# Patient Record
Sex: Female | Born: 1970 | Race: Black or African American | Hispanic: No | Marital: Married | Smoking: Current every day smoker
Health system: Southern US, Community
[De-identification: ages and names within clinical notes are randomized; demographics above are authoritative.]

## PROBLEM LIST (undated history)

## (undated) DIAGNOSIS — N926 Irregular menstruation, unspecified: Secondary | ICD-10-CM

## (undated) DIAGNOSIS — D649 Anemia, unspecified: Secondary | ICD-10-CM

---

## 2018-04-29 ENCOUNTER — Observation Stay (HOSPITAL_BASED_OUTPATIENT_CLINIC_OR_DEPARTMENT_OTHER)
Admission: EM | Admit: 2018-04-29 | Discharge: 2018-05-01 | Disposition: A | Discharge status: Home / Self Care | Payer: Self-pay | Attending: Internal Medicine | Admitting: Internal Medicine

## 2018-04-29 ENCOUNTER — Encounter (HOSPITAL_BASED_OUTPATIENT_CLINIC_OR_DEPARTMENT_OTHER): Payer: Self-pay | Admitting: *Deleted

## 2018-04-29 ENCOUNTER — Other Ambulatory Visit: Payer: Self-pay

## 2018-04-29 DIAGNOSIS — D509 Iron deficiency anemia, unspecified: Principal | ICD-10-CM | POA: Diagnosis present

## 2018-04-29 DIAGNOSIS — D649 Anemia, unspecified: Secondary | ICD-10-CM | POA: Diagnosis present

## 2018-04-29 DIAGNOSIS — R002 Palpitations: Secondary | ICD-10-CM | POA: Insufficient documentation

## 2018-04-29 DIAGNOSIS — N926 Irregular menstruation, unspecified: Secondary | ICD-10-CM | POA: Insufficient documentation

## 2018-04-29 DIAGNOSIS — Z880 Allergy status to penicillin: Secondary | ICD-10-CM | POA: Insufficient documentation

## 2018-04-29 HISTORY — DX: Irregular menstruation, unspecified: N92.6

## 2018-04-29 HISTORY — DX: Anemia, unspecified: D64.9

## 2018-04-29 MED ORDER — SODIUM CHLORIDE 0.9 % IV BOLUS
1000.0000 mL | Freq: Once | INTRAVENOUS | Status: AC
  Administered 2018-04-30: 1000 mL via INTRAVENOUS

## 2018-04-29 NOTE — ED Triage Notes (Signed)
Pt reports feeling rapid heartbeat "like anxiety" since Monday. States the week before she had begun taking apricot seed supplement for a few days. Stats she also has had some dizziness and swelling in some of her fingers and toes.

## 2018-04-30 ENCOUNTER — Encounter (HOSPITAL_COMMUNITY): Payer: Self-pay | Admitting: Internal Medicine

## 2018-04-30 ENCOUNTER — Emergency Department (HOSPITAL_BASED_OUTPATIENT_CLINIC_OR_DEPARTMENT_OTHER): Payer: Self-pay

## 2018-04-30 DIAGNOSIS — D649 Anemia, unspecified: Secondary | ICD-10-CM

## 2018-04-30 DIAGNOSIS — D509 Iron deficiency anemia, unspecified: Secondary | ICD-10-CM

## 2018-04-30 LAB — ABO/RH: ABO/RH(D): O POS

## 2018-04-30 LAB — COMPREHENSIVE METABOLIC PANEL
ALBUMIN: 3.9 g/dL (ref 3.5–5.0)
ALT: 8 U/L (ref 0–44)
ANION GAP: 7 (ref 5–15)
AST: 16 U/L (ref 15–41)
Alkaline Phosphatase: 62 U/L (ref 38–126)
BILIRUBIN TOTAL: 0.3 mg/dL (ref 0.3–1.2)
BUN: 8 mg/dL (ref 6–20)
CHLORIDE: 108 mmol/L (ref 98–111)
CO2: 21 mmol/L — ABNORMAL LOW (ref 22–32)
Calcium: 8.7 mg/dL — ABNORMAL LOW (ref 8.9–10.3)
Creatinine, Ser: 0.84 mg/dL (ref 0.44–1.00)
GFR calc Af Amer: >60 mL/min (ref >60)
GFR calc non Af Amer: >60 mL/min (ref >60)
GLUCOSE: 118 mg/dL — AB (ref 70–99)
POTASSIUM: 3.8 mmol/L (ref 3.5–5.1)
Sodium: 136 mmol/L (ref 135–145)
TOTAL PROTEIN: 7.4 g/dL (ref 6.5–8.1)

## 2018-04-30 LAB — URINALYSIS, MICROSCOPIC (REFLEX)

## 2018-04-30 LAB — RETICULOCYTES
RBC.: 3.32 MIL/uL — AB (ref 3.87–5.11)
RETIC COUNT ABSOLUTE: 43.2 10*3/uL (ref 19.0–186.0)
Retic Ct Pct: 1.3 % (ref 0.4–3.1)

## 2018-04-30 LAB — HEMOGLOBIN AND HEMATOCRIT, BLOOD
HCT: 30.7 % — ABNORMAL LOW (ref 36.0–46.0)
Hemoglobin: 8.8 g/dL — ABNORMAL LOW (ref 12.0–15.0)

## 2018-04-30 LAB — CBC
HCT: 20.1 % — ABNORMAL LOW (ref 36.0–46.0)
Hemoglobin: 5.5 g/dL — CL (ref 12.0–15.0)
MCH: 16.7 pg — ABNORMAL LOW (ref 26.0–34.0)
MCHC: 27.4 g/dL — ABNORMAL LOW (ref 30.0–36.0)
MCV: 61.1 fL — AB (ref 78.0–100.0)
PLATELETS: 612 10*3/uL — AB (ref 150–400)
RBC: 3.29 MIL/uL — ABNORMAL LOW (ref 3.87–5.11)
RDW: 22.8 % — AB (ref 11.5–15.5)
WBC: 8.8 10*3/uL (ref 4.0–10.5)

## 2018-04-30 LAB — PREGNANCY, URINE: PREG TEST UR: NEGATIVE

## 2018-04-30 LAB — URINALYSIS, ROUTINE W REFLEX MICROSCOPIC
Bilirubin Urine: NEGATIVE
GLUCOSE, UA: NEGATIVE mg/dL
KETONES UR: NEGATIVE mg/dL
LEUKOCYTES UA: NEGATIVE
Nitrite: NEGATIVE
PROTEIN: NEGATIVE mg/dL
Specific Gravity, Urine: <1.005 — ABNORMAL LOW (ref 1.005–1.030)
pH: 5.5 (ref 5.0–8.0)

## 2018-04-30 LAB — PREPARE RBC (CROSSMATCH)

## 2018-04-30 LAB — FERRITIN: FERRITIN: 2 ng/mL — AB (ref 11–307)

## 2018-04-30 LAB — IRON AND TIBC
Iron: 13 ug/dL — ABNORMAL LOW (ref 28–170)
Saturation Ratios: 3 % — ABNORMAL LOW (ref 10.4–31.8)
TIBC: 463 ug/dL — ABNORMAL HIGH (ref 250–450)
UIBC: 450 ug/dL

## 2018-04-30 LAB — VITAMIN B12: Vitamin B-12: 354 pg/mL (ref 180–914)

## 2018-04-30 LAB — OCCULT BLOOD X 1 CARD TO LAB, STOOL: FECAL OCCULT BLD: NEGATIVE

## 2018-04-30 LAB — HIV ANTIBODY (ROUTINE TESTING W REFLEX): HIV SCREEN 4TH GENERATION: NONREACTIVE

## 2018-04-30 LAB — FOLATE: FOLATE: 9.8 ng/mL (ref >5.9)

## 2018-04-30 LAB — LIPASE, BLOOD: LIPASE: 33 U/L (ref 11–51)

## 2018-04-30 MED ORDER — ONDANSETRON HCL 4 MG PO TABS
4.0000 mg | ORAL_TABLET | Freq: Four times a day (QID) | ORAL | Status: DC | PRN
  Administered 2018-05-01: 4 mg via ORAL
  Filled 2018-04-30: qty 1

## 2018-04-30 MED ORDER — ACETAMINOPHEN 650 MG RE SUPP: 650.0000 mg | Freq: Four times a day (QID) | RECTAL | Status: DC | PRN

## 2018-04-30 MED ORDER — SODIUM CHLORIDE 0.9% IV SOLUTION
Freq: Once | INTRAVENOUS | Status: AC
  Administered 2018-04-30: 1000 mL via INTRAVENOUS

## 2018-04-30 MED ORDER — ENOXAPARIN SODIUM 40 MG/0.4ML ~~LOC~~ SOLN: 40.0000 mg | SUBCUTANEOUS | Status: DC

## 2018-04-30 MED ORDER — FERROUS SULFATE 325 (65 FE) MG PO TABS
325.0000 mg | ORAL_TABLET | Freq: Two times a day (BID) | ORAL | Status: DC
  Administered 2018-04-30: 325 mg via ORAL
  Filled 2018-04-30: qty 1

## 2018-04-30 MED ORDER — SODIUM CHLORIDE 0.9 % IV SOLN
510.0000 mg | Freq: Once | INTRAVENOUS | Status: AC
  Administered 2018-04-30: 510 mg via INTRAVENOUS
  Filled 2018-04-30: qty 17

## 2018-04-30 MED ORDER — ONDANSETRON HCL 4 MG/2ML IJ SOLN: 4.0000 mg | Freq: Four times a day (QID) | INTRAMUSCULAR | Status: DC | PRN

## 2018-04-30 MED ORDER — FERROUS SULFATE 325 (65 FE) MG PO TABS
325.0000 mg | ORAL_TABLET | Freq: Two times a day (BID) | ORAL | Status: DC
  Administered 2018-05-01: 325 mg via ORAL
  Filled 2018-04-30: qty 1

## 2018-04-30 MED ORDER — SODIUM CHLORIDE 0.9% IV SOLUTION
Freq: Once | INTRAVENOUS | Status: DC
  Filled 2018-04-30: qty 250

## 2018-04-30 MED ORDER — ACETAMINOPHEN 325 MG PO TABS
650.0000 mg | ORAL_TABLET | Freq: Four times a day (QID) | ORAL | Status: DC | PRN
  Administered 2018-04-30 (×2): 650 mg via ORAL
  Filled 2018-04-30 (×2): qty 2

## 2018-04-30 NOTE — Progress Notes (Signed)
First sample of stool for Fecal Occult Blood was sent to the lab.

## 2018-04-30 NOTE — Progress Notes (Signed)
Tele monitor was D/C per MD order. Will continue to monitor.

## 2018-04-30 NOTE — ED Notes (Signed)
Lab called and reports a hgb of 5.5. Primary nurse and Dr. Patria Maneampos aware.

## 2018-04-30 NOTE — Progress Notes (Signed)
Blood Bank will call whenever blood is ready to be transfused.

## 2018-04-30 NOTE — Plan of Care (Signed)
Patient with symptomatic anemia, DOE for past 1-2 weeks.  Diagnosed with anemia years ago but not on iron supplementation.  HGB 5.5.  No acute bleeding stigmata, no menorrhagia history.  Didn't get hem occulted though.  Will put in for Tele bed and 2u PRBC transfusion as well as anemia pnl.

## 2018-04-30 NOTE — Progress Notes (Signed)
Admitting [facility] notified patient has arrived

## 2018-04-30 NOTE — Progress Notes (Signed)
Lab notified of Stat type & screen from 158 AM to be drawn.

## 2018-04-30 NOTE — Progress Notes (Signed)
Patient very upset about her meals today. We tried to talk with the kitchen but they sent to her cold food and with no taste. She ordered three trays for lunch and she refused all of them.

## 2018-04-30 NOTE — Progress Notes (Signed)
  PROGRESS NOTE  Patient admitted earlier this morning. See H&P. Maria Wright is a 47 y.o. female with medical history significant of chronic anemia for many years. She believes she has been anemic since her last pregnancy which was 29 years ago. Patient presents to the ED with complaint of lightheadedness and dyspnea with exertion over the past 1-2 weeks.  She denies heavy vaginal bleeding, but states that she uses approx 30 pads during a menstrual cycle. She denies any other blood loss, no black tarry stools or bright red blood per rectum. She denies nausea, vomiting.   Transfuse 2u pRBC Will order IV feraheme as her ferritin is only 2  Repeat labs in AM  FOBT pending    Noralee StainJennifer Finlee Concepcion, DO [facility] [email or web address] Password [email or web address] 04/30/2018, 11:17 AM

## 2018-04-30 NOTE — ED Provider Notes (Addendum)
[facility] EMERGENCY DEPARTMENT Provider Note   CSN: [id number] Arrival date & [id number]: 04/29/18  2301     History   Chief Complaint Chief Complaint  Patient presents with  . Palpitations    HPI Maria Wright is a 47 y.o. female.  HPI Patient is a 47 year old female presents to the emergency department with lightheadedness and shortness of breath with exertion over the past 1 to 2 weeks.  She has a history of anemia diagnosed years ago.  She is not on iron supplementation.  She has not had a physical exam in several years.  She denies heavy vaginal bleeding.  Denies abdominal pain.  Denies nausea vomiting.  No diarrhea.  Reports mild anorexia over the past several days but is still drinking fluids.  No dysuria and urinary frequency.  She began using apricot seed supplement a few days ago to see if this would help with her strength.  No other complaints.   History reviewed. No pertinent past medical history.  There are no active problems to display for this patient.   History reviewed. No pertinent surgical history.   OB History   None      Home Medications    Prior to Admission medications   Not on File    Family History No family history on file.  Social History Social History   Tobacco Use  . Smoking status: Current Every Day Smoker  . Smokeless tobacco: Never Used  Substance Use Topics  . Alcohol use: Not Currently  . Drug use: Yes    Types: Marijuana     Allergies   Penicillins   Review of Systems Review of Systems  All other systems reviewed and are negative.    Physical Exam Updated Vital Signs BP (!) 160/71 (BP Location: Left Arm)   Pulse 89   Temp 98.5 F (36.9 C) (Oral)   Resp (!) 22   Ht 5\' 4"  (1.626 m)   Wt 65.8 kg (145 lb)   LMP 04/01/2018   SpO2 100%   BMI 24.89 kg/m   Physical Exam  Constitutional: She is oriented to person, place, and time. She appears well-developed and well-nourished. No distress.  HENT:    Head: Normocephalic and atraumatic.  Eyes: EOM are normal.  Neck: Normal range of motion.  Cardiovascular: Normal rate, regular rhythm and normal heart sounds.  Pulmonary/Chest: Effort normal and breath sounds normal.  Abdominal: Soft. She exhibits no distension. There is no tenderness.  Musculoskeletal: Normal range of motion.  Neurological: She is alert and oriented to person, place, and time.  Skin: Skin is warm and dry.  Psychiatric: She has a normal mood and affect. Judgment normal.  Nursing note and vitals reviewed.    ED Treatments / Results  Labs (all labs ordered are listed, but only abnormal results are displayed) Labs Reviewed  CBC - Abnormal; Notable for the following components:      Result Value   RBC 3.29 (*)    Hemoglobin 5.5 (*)    HCT 20.1 (*)    MCV 61.1 (*)    MCH 16.7 (*)    MCHC 27.4 (*)    RDW 22.8 (*)    Platelets 612 (*)    All other components within normal limits  URINALYSIS, ROUTINE W REFLEX MICROSCOPIC - Abnormal; Notable for the following components:   Specific Gravity, Urine <1.005 (*)    Hgb urine dipstick TRACE (*)    All other components within normal limits  COMPREHENSIVE METABOLIC PANEL -  Abnormal; Notable for the following components:   CO2 21 (*)    Glucose, Bld 118 (*)    Calcium 8.7 (*)    All other components within normal limits  URINALYSIS, MICROSCOPIC (REFLEX) - Abnormal; Notable for the following components:   Bacteria, UA RARE (*)    All other components within normal limits  PREGNANCY, URINE  LIPASE, BLOOD  VITAMIN B12  FOLATE  IRON AND TIBC  FERRITIN  RETICULOCYTES    EKG EKG Interpretation  Date/Time:  Saturday April 29 2018 23:11:15 EDT Ventricular Rate:  89 PR Interval:  150 QRS Duration: 68 QT Interval:  360 QTC Calculation: 438 R Axis:   47 Text Interpretation:  Normal sinus rhythm Normal ECG No old tracing to compare Confirmed by Azalia Bilis ([id number]) on 04/29/2018 11:22:56 PM   Radiology Dg Chest 2  View  Result Date: 04/30/2018 CLINICAL DATA:  Dizziness and tachycardia. Numbness in the fingers for 1 week. EXAM: CHEST - 2 VIEW COMPARISON:  Chest CT 03/20/2015. FINDINGS: The heart size and mediastinal contours are normal. The lungs are clear. There is no pleural effusion or pneumothorax. No acute osseous findings are identified. IMPRESSION: No active cardiopulmonary process. Electronically Signed   By: Carey Bullocks M.D.   On: 04/30/2018 00:40    Procedures .Critical Care Performed by: Azalia Bilis, MD Authorized by: Azalia Bilis, MD     CRITICAL CARE Performed by: Azalia Bilis Total critical care time: 31 minutes Critical care time was exclusive of separately billable procedures and treating other patients. Critical care was necessary to treat or prevent imminent or life-threatening deterioration. Critical care was time spent personally by me on the following activities: development of treatment plan with patient and/or surrogate as well as nursing, discussions with consultants, evaluation of patient's response to treatment, examination of patient, obtaining history from patient or surrogate, ordering and performing treatments and interventions, ordering and review of laboratory studies, ordering and review of radiographic studies, pulse oximetry and re-evaluation of patient's condition.   Medications Ordered in ED Medications  sodium chloride 0.9 % bolus 1,000 mL (1,000 mLs Intravenous New Bag/Given 04/30/18 0029)     Initial Impression / Assessment and Plan / ED Course  I have reviewed the triage vital signs and the nursing notes.  Pertinent labs & imaging results that were available during my care of the patient were reviewed by me and considered in my medical decision making (see chart for details).     Sent to back anemia.  Patient will need admission in the hospital for blood transfusion given her hemoglobin of 5.5.  Anemia panel ordered.  Patient updated.  Final  Clinical Impressions(s) / ED Diagnoses   Final diagnoses:  Symptomatic anemia    ED Discharge Orders    None       Azalia Bilis, MD 04/30/18 Lenoria Chime    Azalia Bilis, MD 05/05/18 1105

## 2018-04-30 NOTE — ED Notes (Signed)
Report given to ChurchvilleJustin, Charity fundraiserN. [facility]

## 2018-04-30 NOTE — Progress Notes (Signed)
Writer called blood bank and they said they don't have an order to prepare 2 units of blood. They had an order only to do type and screen. MD was notified. Will continue to monitor.

## 2018-04-30 NOTE — H&P (Signed)
History and Physical    Maria Wright WUJ:[id number] DOB: 05-29-1971 DOA: 04/29/2018  PCP: Patient, No Pcp Per  Patient coming from: Home  I have personally briefly reviewed patient's old medical records in [facility]  Chief Complaint: Palpitations  HPI: Maria Wright is a 47 y.o. female with medical history significant of Anemia for many years, iron deficiency she thinks, not taking supplementation.  Hasnt had blood levels checked or a physical exam in several years.  Patient presents to the ED with c/o lightheadedness and DOE over the past 1-2 weeks.  She denies heavy vaginal bleeding.  Denies abdominal pain.  Denies nausea vomiting.  No diarrhea.  Reports mild anorexia over the past several days but is still drinking fluids.  No melena, no hematochezia.  She does note she has irregular periods that are sometimes heavy she thinks.   ED Course: HGB 5.5 in a microcytic / hypochromic pattern.  Iron panel confirms iron deficiency.   Review of Systems: As per HPI otherwise 10 point review of systems negative.   Past Medical History:  Diagnosis Date  . Anemia   . Irregular periods     History reviewed. No pertinent surgical history.   reports that she has been smoking.  She has never used smokeless tobacco. She reports that she drank alcohol. She reports that she has current or past drug history. Drug: Marijuana.  Allergies  Allergen Reactions  . Penicillins Hives    Family History  Problem Relation Age of Onset  . Iron deficiency Mother      Prior to Admission medications   Not on File    Physical Exam: Vitals:   04/29/18 2319 04/30/18 0226 04/30/18 0300 04/30/18 0336  BP: (!) 160/71 130/60  (!) 147/86  Pulse: 89 92  91  Resp: (!) 22 18  20   Temp: 98.5 F (36.9 C)     TempSrc: Oral     SpO2: 100% 100%  100%  Weight:      Height:   5\' 4"  (1.626 m)     Constitutional: NAD, calm, comfortable Eyes: PERRL, lids and conjunctivae normal ENMT: Mucous  membranes are moist. Posterior pharynx clear of any exudate or lesions.Normal dentition.  Neck: normal, supple, no masses, no thyromegaly Respiratory: clear to auscultation bilaterally, no wheezing, no crackles. Normal respiratory effort. No accessory muscle use.  Cardiovascular: Regular rate and rhythm, no murmurs / rubs / gallops. No extremity edema. 2+ pedal pulses. No carotid bruits.  Abdomen: no tenderness, no masses palpated. No hepatosplenomegaly. Bowel sounds positive.  Musculoskeletal: no clubbing / cyanosis. No joint deformity upper and lower extremities. Good ROM, no contractures. Normal muscle tone.  Skin: no rashes, lesions, ulcers. No induration Neurologic: CN 2-12 grossly intact. Sensation intact, DTR normal. Strength 5/5 in all 4.  Psychiatric: Normal judgment and insight. Alert and oriented x 3. Normal mood.    Labs on Admission: I have personally reviewed following labs and imaging studies  CBC: Recent Labs  Lab 04/30/18 0027  WBC 8.8  HGB 5.5*  HCT 20.1*  MCV 61.1*  PLT 612*   Basic Metabolic Panel: Recent Labs  Lab 04/30/18 0027  NA 136  K 3.8  CL 108  CO2 21*  GLUCOSE 118*  BUN 8  CREATININE 0.84  CALCIUM 8.7*   GFR: Estimated Creatinine Clearance: 76.4 mL/min (by C-G formula based on SCr of 0.84 mg/dL). Liver Function Tests: Recent Labs  Lab 04/30/18 0027  AST 16  ALT 8  ALKPHOS 62  BILITOT  0.3  PROT 7.4  ALBUMIN 3.9   Recent Labs  Lab 04/30/18 0027  LIPASE 33   No results for input(s): AMMONIA in the last 168 hours. Coagulation Profile: No results for input(s): INR, PROTIME in the last 168 hours. Cardiac Enzymes: No results for input(s): CKTOTAL, CKMB, CKMBINDEX, TROPONINI in the last 168 hours. BNP (last 3 results) No results for input(s): PROBNP in the last 8760 hours. HbA1C: No results for input(s): HGBA1C in the last 72 hours. CBG: No results for input(s): GLUCAP in the last 168 hours. Lipid Profile: No results for input(s):  CHOL, HDL, LDLCALC, TRIG, CHOLHDL, LDLDIRECT in the last 72 hours. Thyroid Function Tests: No results for input(s): TSH, T4TOTAL, FREET4, T3FREE, THYROIDAB in the last 72 hours. Anemia Panel: Recent Labs    04/30/18 0027  VITAMINB12 354  FOLATE 9.8  FERRITIN 2*  TIBC 463*  IRON 13*  RETICCTPCT 1.3   Urine analysis:    Component Value Date/Time   COLORURINE YELLOW 04/29/2018 2330   APPEARANCEUR CLEAR 04/29/2018 2330   LABSPEC <1.005 (L) 04/29/2018 2330   PHURINE 5.5 04/29/2018 2330   GLUCOSEU NEGATIVE 04/29/2018 2330   HGBUR TRACE (A) 04/29/2018 2330   BILIRUBINUR NEGATIVE 04/29/2018 2330   KETONESUR NEGATIVE 04/29/2018 2330   PROTEINUR NEGATIVE 04/29/2018 2330   NITRITE NEGATIVE 04/29/2018 2330   LEUKOCYTESUR NEGATIVE 04/29/2018 2330    Radiological Exams on Admission: Dg Chest 2 View  Result Date: 04/30/2018 CLINICAL DATA:  Dizziness and tachycardia. Numbness in the fingers for 1 week. EXAM: CHEST - 2 VIEW COMPARISON:  Chest CT 03/20/2015. FINDINGS: The heart size and mediastinal contours are normal. The lungs are clear. There is no pleural effusion or pneumothorax. No acute osseous findings are identified. IMPRESSION: No active cardiopulmonary process. Electronically Signed   By: Carey BullocksWilliam  Veazey M.D.   On: 04/30/2018 00:40    EKG: Independently reviewed.  Assessment/Plan Principal Problem:   Symptomatic anemia Active Problems:   Iron deficiency anemia    1. Symptomatic iron deficiency anemia - probably very chronic, no acute blood loss source identified 1. Transfuse 2u PRBC 2. Start PO iron 3. Repeat H/H post transfusion 4. Send stool for hemoccult, but no stigmata of acute large GIB on HPI  DVT prophylaxis: Lovenox Code Status: Full Family Communication: Husband at bedside, they are reading over the transfusion consent form now, all questions answered. Disposition Plan: Home after admit Consults called: None Admission status: Place in obs   Hillary BowGARDNER, Lavita Pontius  M. DO [facility] Pager [phone] Only works nights!  If 7AM-7PM, please contact the primary day team physician taking care of patient  [email or web address] Password [email or web address]  04/30/2018, 4:26 AM

## 2018-05-01 LAB — TYPE AND SCREEN
ABO/RH(D): O POS
Antibody Screen: NEGATIVE
Unit division: 0
Unit division: 0

## 2018-05-01 LAB — BPAM RBC
BLOOD PRODUCT EXPIRATION DATE: 201908102359
Blood Product Expiration Date: 201908102359
ISSUE DATE / TIME: 201907140755
ISSUE DATE / TIME: 201907141052
Unit Type and Rh: 5100
Unit Type and Rh: 5100

## 2018-05-01 LAB — CBC
HCT: 30.4 % — ABNORMAL LOW (ref 36.0–46.0)
Hemoglobin: 8.6 g/dL — ABNORMAL LOW (ref 12.0–15.0)
MCH: 19.6 pg — ABNORMAL LOW (ref 26.0–34.0)
MCHC: 28.3 g/dL — ABNORMAL LOW (ref 30.0–36.0)
MCV: 69.4 fL — ABNORMAL LOW (ref 78.0–100.0)
PLATELETS: 507 10*3/uL — AB (ref 150–400)
RBC: 4.38 MIL/uL (ref 3.87–5.11)
RDW: 27.9 % — AB (ref 11.5–15.5)
WBC: 8 10*3/uL (ref 4.0–10.5)

## 2018-05-01 MED ORDER — FERROUS SULFATE 325 (65 FE) MG PO TABS: 325.0000 mg | ORAL_TABLET | Freq: Two times a day (BID) | ORAL | 0 refills | Status: DC

## 2018-05-01 MED ORDER — ALUM & MAG HYDROXIDE-SIMETH 200-200-20 MG/5ML PO SUSP
30.0000 mL | Freq: Four times a day (QID) | ORAL | Status: DC | PRN
  Administered 2018-05-01: 30 mL via ORAL
  Filled 2018-05-01: qty 30

## 2018-05-01 NOTE — Discharge Summary (Signed)
Physician Discharge Summary  Maria Wright:[name] DOB: 08/21/1971 DOA: 04/29/2018  PCP: Patient, No Pcp Per  Admit date: 04/29/2018 Discharge date: 05/01/2018  Admitted From: Home Disposition:  Home  Recommendations for Outpatient Follow-up:  1. Follow up with PCP in 1 week. Encouraged to establish with PCP as soon as possible.  2. Please obtain CBC in 1-2 weeks   Discharge Condition: Stable CODE STATUS: Full  Diet recommendation: Regular diet   Brief/Interim Summary: Maria Wright is a 47 y.o.femalewith medical history significant ofchronic anemia for many years. She believes she has been anemic since her last pregnancy which was 29 years ago.Patient presents to the ED with complaint of lightheadedness and dyspnea with exertion over the past 1-2 weeks. She denies heavy vaginal bleeding, but states that she uses approx 30 pads during a menstrual cycle. She denies any other blood loss, no black tarry stools or bright red blood per rectum. She denies nausea, vomiting. FOBT was negative. She was found to have Hgb 5.5 and was given 2u pRBC with improvement of Hgb to 8.8. Iron studies revealed iron deficiency anemia with iron 13, ferritin 2, saturation ratio 3, TIBC 463. She was given IV feraheme and prescription for oral iron supplementation.   Discharge Diagnoses:  Principal Problem:   Symptomatic anemia Active Problems:   Iron deficiency anemia   Discharge Instructions  Discharge Instructions    Call MD for:  difficulty breathing, headache or visual disturbances   Complete by:  As directed    Call MD for:  extreme fatigue   Complete by:  As directed    Call MD for:  hives   Complete by:  As directed    Call MD for:  persistant dizziness or light-headedness   Complete by:  As directed    Call MD for:  persistant nausea and vomiting   Complete by:  As directed    Call MD for:  severe uncontrolled pain   Complete by:  As directed    Call MD for:  temperature >100.4    Complete by:  As directed    Diet general   Complete by:  As directed    Discharge instructions   Complete by:  As directed    You were cared for by a hospitalist during your hospital stay. If you have any questions about your discharge medications or the care you received while you were in the hospital after you are discharged, you can call the unit and ask to speak with the hospitalist on call if the hospitalist that took care of you is not available. Once you are discharged, your primary care physician will handle any further medical issues. Please note that NO REFILLS for any discharge medications will be authorized once you are discharged, as it is imperative that you return to your primary care physician (or establish a relationship with a primary care physician if you do not have one) for your aftercare needs so that they can reassess your need for medications and monitor your lab values.   Increase activity slowly   Complete by:  As directed      Allergies as of 05/01/2018      Reactions   Penicillins Hives   Has patient had a PCN reaction causing immediate rash, facial/tongue/throat swelling, SOB or lightheadedness with hypotension: Yes Has patient had a PCN reaction causing severe rash involving mucus membranes or skin necrosis: Yes Has patient had a PCN reaction that required hospitalization: No Has patient had a PCN reaction  occurring within the last 10 years: No If all of the above answers are "NO", then may proceed with Cephalosporin use.      Medication List    TAKE these medications   ferrous sulfate 325 (65 FE) MG tablet Take 1 tablet (325 mg total) by mouth 2 (two) times daily with a meal.      Follow-up Information    [facility] Follow up.   Why:  Call to establish care with a primary care provider as soon as possible  Contact information: [address] [id number] [phone]         Allergies   Allergen Reactions  . Penicillins Hives    Has patient had a PCN reaction causing immediate rash, facial/tongue/throat swelling, SOB or lightheadedness with hypotension: Yes Has patient had a PCN reaction causing severe rash involving mucus membranes or skin necrosis: Yes Has patient had a PCN reaction that required hospitalization: No Has patient had a PCN reaction occurring within the last 10 years: No If all of the above answers are "NO", then may proceed with Cephalosporin use.     Consultations:  None    Procedures/Studies: Dg Chest 2 View  Result Date: 04/30/2018 CLINICAL DATA:  Dizziness and tachycardia. Numbness in the fingers for 1 week. EXAM: CHEST - 2 VIEW COMPARISON:  Chest CT 03/20/2015. FINDINGS: The heart size and mediastinal contours are normal. The lungs are clear. There is no pleural effusion or pneumothorax. No acute osseous findings are identified. IMPRESSION: No active cardiopulmonary process. Electronically Signed   By: Carey Bullocks M.D.   On: 04/30/2018 00:40       Discharge Exam: Vitals:   04/30/18 2151 05/01/18 0417  BP: 130/86 131/75  Pulse: 64 66  Resp: 18 18  Temp: 98.1 F (36.7 C) 98.4 F (36.9 C)  SpO2: 100% 100%    General: Pt is alert, awake, not in acute distress Cardiovascular: RRR, S1/S2 +, no rubs, no gallops Respiratory: CTA bilaterally, no wheezing, no rhonchi Abdominal: Soft, NT, ND, bowel sounds + Extremities: no edema, no cyanosis    The results of significant diagnostics from this hospitalization (including imaging, microbiology, ancillary and laboratory) are listed below for reference.     Microbiology: No results found for this or any previous visit (from the past 240 hour(s)).   Labs: BNP (last 3 results) No results for input(s): BNP in the last 8760 hours. Basic Metabolic Panel: Recent Labs  Lab 04/30/18 0027  NA 136  K 3.8  CL 108  CO2 21*  GLUCOSE 118*  BUN 8  CREATININE 0.84  CALCIUM 8.7*   Liver  Function Tests: Recent Labs  Lab 04/30/18 0027  AST 16  ALT 8  ALKPHOS 62  BILITOT 0.3  PROT 7.4  ALBUMIN 3.9   Recent Labs  Lab 04/30/18 0027  LIPASE 33   No results for input(s): AMMONIA in the last 168 hours. CBC: Recent Labs  Lab 04/30/18 0027 04/30/18 1514 05/01/18 0633  WBC 8.8  --  8.0  HGB 5.5* 8.8* 8.6*  HCT 20.1* 30.7* 30.4*  MCV 61.1*  --  69.4*  PLT 612*  --  507*   Cardiac Enzymes: No results for input(s): CKTOTAL, CKMB, CKMBINDEX, TROPONINI in the last 168 hours. BNP: Invalid input(s): POCBNP CBG: No results for input(s): GLUCAP in the last 168 hours. D-Dimer No results for input(s): DDIMER in the last 72 hours. Hgb A1c No results for input(s): HGBA1C in the  last 72 hours. Lipid Profile No results for input(s): CHOL, HDL, LDLCALC, TRIG, CHOLHDL, LDLDIRECT in the last 72 hours. Thyroid function studies No results for input(s): TSH, T4TOTAL, T3FREE, THYROIDAB in the last 72 hours.  Invalid input(s): FREET3 Anemia work up Recent Labs    04/30/18 0027  VITAMINB12 354  FOLATE 9.8  FERRITIN 2*  TIBC 463*  IRON 13*  RETICCTPCT 1.3   Urinalysis    Component Value Date/Time   COLORURINE YELLOW 04/29/2018 2330   APPEARANCEUR CLEAR 04/29/2018 2330   LABSPEC <1.005 (L) 04/29/2018 2330   PHURINE 5.5 04/29/2018 2330   GLUCOSEU NEGATIVE 04/29/2018 2330   HGBUR TRACE (A) 04/29/2018 2330   BILIRUBINUR NEGATIVE 04/29/2018 2330   KETONESUR NEGATIVE 04/29/2018 2330   PROTEINUR NEGATIVE 04/29/2018 2330   NITRITE NEGATIVE 04/29/2018 2330   LEUKOCYTESUR NEGATIVE 04/29/2018 2330   Sepsis Labs Invalid input(s): PROCALCITONIN,  WBC,  LACTICIDVEN Microbiology No results found for this or any previous visit (from the past 240 hour(s)).   Patient was seen and examined on the day of discharge and was found to be in stable condition. Time coordinating discharge: 25 minutes including assessment and coordination of care, as well as examination of the patient.    SIGNED:  Noralee Stain, DO [facility] Pager [phone]  If 7PM-7AM, please contact night-coverage [email or web address] Password [facility] 05/01/2018, 8:39 AM

## 2018-05-01 NOTE — Progress Notes (Signed)
Patient discharged home with husband. Discharge instructions given, all questions answered. Extended amount of time was spent providing education around the importance of rest, increasing dietary iron, attending follow up appointments, and taking all medications as prescribed. All belongings sent with patient.

## 2018-05-01 NOTE — Care Management Note (Signed)
Case Management Note  Patient Details  Name: Maria Wright MRN: [id number] Date of Birth: 1971-03-27  Subjective/Objective:   Pt admitted on 04/29/18 with symptomatic iron deficiency anemia.  PTA, pt independent of ADLS, lives with spouse.                 Action/Plan: Pt medically stable for dc home today.  Pt is uninsured, and has no PCP.  [facility] at 2 [facility], with pt consent.  Appt information put on AVS in Epic.    Expected Discharge Date:  05/01/18               Expected Discharge Plan:  Home/Self Care  In-House Referral:     Discharge planning Services  CM Consult, Indigent [facility], Follow-up appt scheduled  Post Acute Care Choice:    Choice offered to:     DME Arranged:    DME Agency:     HH Arranged:    HH Agency:     Status of Service:  Completed, signed off  If discussed at [employer] Meetings, dates discussed:    Additional Comments:  Glennon Macmerson, Taisha Pennebaker M, RN 05/01/2018, 10:23 AM

## 2018-05-01 NOTE — Discharge Instructions (Signed)

## 2018-05-16 ENCOUNTER — Inpatient Hospital Stay: Payer: Self-pay

## 2018-09-01 ENCOUNTER — Encounter (HOSPITAL_BASED_OUTPATIENT_CLINIC_OR_DEPARTMENT_OTHER): Payer: Self-pay | Admitting: *Deleted

## 2018-09-01 ENCOUNTER — Emergency Department (HOSPITAL_BASED_OUTPATIENT_CLINIC_OR_DEPARTMENT_OTHER)
Admission: EM | Admit: 2018-09-01 | Discharge: 2018-09-01 | Disposition: A | Discharge status: Home / Self Care | Payer: Self-pay | Attending: Emergency Medicine | Admitting: Emergency Medicine

## 2018-09-01 ENCOUNTER — Emergency Department (HOSPITAL_BASED_OUTPATIENT_CLINIC_OR_DEPARTMENT_OTHER): Payer: Self-pay

## 2018-09-01 ENCOUNTER — Other Ambulatory Visit: Payer: Self-pay

## 2018-09-01 DIAGNOSIS — R030 Elevated blood-pressure reading, without diagnosis of hypertension: Secondary | ICD-10-CM | POA: Insufficient documentation

## 2018-09-01 DIAGNOSIS — J069 Acute upper respiratory infection, unspecified: Secondary | ICD-10-CM | POA: Insufficient documentation

## 2018-09-01 DIAGNOSIS — N3000 Acute cystitis without hematuria: Secondary | ICD-10-CM | POA: Insufficient documentation

## 2018-09-01 DIAGNOSIS — R0602 Shortness of breath: Secondary | ICD-10-CM | POA: Insufficient documentation

## 2018-09-01 DIAGNOSIS — M7918 Myalgia, other site: Secondary | ICD-10-CM | POA: Insufficient documentation

## 2018-09-01 LAB — COMPREHENSIVE METABOLIC PANEL
ALT: 17 U/L (ref 0–44)
ANION GAP: 11 (ref 5–15)
AST: 19 U/L (ref 15–41)
Albumin: 4 g/dL (ref 3.5–5.0)
Alkaline Phosphatase: 83 U/L (ref 38–126)
BILIRUBIN TOTAL: 0.5 mg/dL (ref 0.3–1.2)
BUN: 10 mg/dL (ref 6–20)
CALCIUM: 9.4 mg/dL (ref 8.9–10.3)
CO2: 23 mmol/L (ref 22–32)
Chloride: 104 mmol/L (ref 98–111)
Creatinine, Ser: 0.78 mg/dL (ref 0.44–1.00)
GFR calc non Af Amer: >60 mL/min (ref >60)
GLUCOSE: 92 mg/dL (ref 70–99)
Potassium: 3.7 mmol/L (ref 3.5–5.1)
Sodium: 138 mmol/L (ref 135–145)
TOTAL PROTEIN: 8 g/dL (ref 6.5–8.1)

## 2018-09-01 LAB — URINALYSIS, MICROSCOPIC (REFLEX)

## 2018-09-01 LAB — MAGNESIUM: MAGNESIUM: 2.1 mg/dL (ref 1.7–2.4)

## 2018-09-01 LAB — URINALYSIS, ROUTINE W REFLEX MICROSCOPIC
Bilirubin Urine: NEGATIVE
Glucose, UA: NEGATIVE mg/dL
Ketones, ur: NEGATIVE mg/dL
LEUKOCYTES UA: NEGATIVE
NITRITE: POSITIVE — AB
PROTEIN: NEGATIVE mg/dL
SPECIFIC GRAVITY, URINE: 1.025 (ref 1.005–1.030)
pH: 6 (ref 5.0–8.0)

## 2018-09-01 LAB — CBC WITH DIFFERENTIAL/PLATELET
Abs Immature Granulocytes: 0.03 10*3/uL (ref 0.00–0.07)
Basophils Absolute: 0 10*3/uL (ref 0.0–0.1)
Basophils Relative: 0 %
EOS ABS: 0 10*3/uL (ref 0.0–0.5)
EOS PCT: 0 %
HEMATOCRIT: 38.8 % (ref 36.0–46.0)
Hemoglobin: 12.1 g/dL (ref 12.0–15.0)
IMMATURE GRANULOCYTES: 0 %
Lymphocytes Relative: 24 %
Lymphs Abs: 2.4 10*3/uL (ref 0.7–4.0)
MCH: 26.4 pg (ref 26.0–34.0)
MCHC: 31.2 g/dL (ref 30.0–36.0)
MCV: 84.7 fL (ref 80.0–100.0)
MONO ABS: 0.5 10*3/uL (ref 0.1–1.0)
MONOS PCT: 5 %
Neutro Abs: 7.2 10*3/uL (ref 1.7–7.7)
Neutrophils Relative %: 71 %
Platelets: 339 10*3/uL (ref 150–400)
RBC: 4.58 MIL/uL (ref 3.87–5.11)
RDW: 14.2 % (ref 11.5–15.5)
WBC: 10.3 10*3/uL (ref 4.0–10.5)
nRBC: 0 % (ref 0.0–0.2)

## 2018-09-01 LAB — PREGNANCY, URINE: PREG TEST UR: NEGATIVE

## 2018-09-01 MED ORDER — ONDANSETRON HCL 4 MG/2ML IJ SOLN
4.0000 mg | Freq: Once | INTRAMUSCULAR | Status: AC
  Administered 2018-09-01: 4 mg via INTRAVENOUS
  Filled 2018-09-01: qty 2

## 2018-09-01 MED ORDER — PROCHLORPERAZINE EDISYLATE 10 MG/2ML IJ SOLN
10.0000 mg | Freq: Once | INTRAMUSCULAR | Status: AC
  Administered 2018-09-01: 10 mg via INTRAVENOUS
  Filled 2018-09-01: qty 2

## 2018-09-01 MED ORDER — NITROFURANTOIN MONOHYD MACRO 100 MG PO CAPS
100.0000 mg | ORAL_CAPSULE | Freq: Once | ORAL | Status: AC
  Administered 2018-09-01: 100 mg via ORAL
  Filled 2018-09-01: qty 1

## 2018-09-01 MED ORDER — PROCHLORPERAZINE MALEATE 10 MG PO TABS: 10.0000 mg | ORAL_TABLET | Freq: Two times a day (BID) | ORAL | 0 refills | Status: AC | PRN

## 2018-09-01 MED ORDER — BUTALBITAL-APAP-CAFFEINE 50-325-40 MG PO TABS
1.0000 | ORAL_TABLET | Freq: Once | ORAL | Status: DC
  Filled 2018-09-01: qty 1

## 2018-09-01 MED ORDER — DIPHENHYDRAMINE HCL 50 MG/ML IJ SOLN: 25.0000 mg | Freq: Once | INTRAMUSCULAR | Status: DC

## 2018-09-01 MED ORDER — PROCHLORPERAZINE EDISYLATE 10 MG/2ML IJ SOLN: 10.0000 mg | Freq: Once | INTRAMUSCULAR | Status: DC

## 2018-09-01 MED ORDER — NITROFURANTOIN MONOHYD MACRO 100 MG PO CAPS: 100.0000 mg | ORAL_CAPSULE | Freq: Two times a day (BID) | ORAL | 0 refills | Status: DC

## 2018-09-01 NOTE — ED Triage Notes (Signed)
Pt has been having High BPs for 2 weeks accompanied by SOB. Just not feeling well now. States that she is dizzy as well.

## 2018-09-01 NOTE — ED Provider Notes (Signed)
[facility] EMERGENCY DEPARTMENT Provider Note   CSN: [id number] Arrival date & time: 09/01/18  1101     History   Chief Complaint Chief Complaint  Patient presents with  . Hypertension    HPI Maria Wright is a 47 y.o. female.  The history is provided by the patient and medical records. No language interpreter was used.  Cough  This is a new problem. The current episode started more than 1 week ago. The problem occurs constantly. The problem has not changed since onset.The cough is non-productive. There has been no fever. Associated symptoms include chills, rhinorrhea, myalgias and shortness of breath. Pertinent negatives include no chest pain, no weight loss, no ear congestion, no headaches and no wheezing. She has tried nothing for the symptoms. The treatment provided no relief.    Past Medical History:  Diagnosis Date  . Anemia   . Irregular periods     Patient Active Problem List   Diagnosis Date Noted  . Symptomatic anemia 04/30/2018  . Iron deficiency anemia 04/30/2018    History reviewed. No pertinent surgical history.   OB History   None      Home Medications    Prior to Admission medications   Medication Sig Start Date End Date Taking? Authorizing Provider  ferrous sulfate 325 (65 FE) MG tablet Take 1 tablet (325 mg total) by mouth 2 (two) times daily with a meal. 05/01/18   Noralee Stain, DO    Family History Family History  Problem Relation Age of Onset  . Iron deficiency Mother     Social History Social History   Tobacco Use  . Smoking status: Current Every Day Smoker  . Smokeless tobacco: Never Used  Substance Use Topics  . Alcohol use: Not Currently  . Drug use: Yes    Types: Marijuana     Allergies   Penicillins   Review of Systems Review of Systems  Constitutional: Positive for chills and fatigue. Negative for appetite change, diaphoresis, fever and weight loss.  HENT: Positive for congestion and rhinorrhea.     Respiratory: Positive for cough and shortness of breath. Negative for chest tightness, wheezing and stridor.   Cardiovascular: Negative for chest pain, palpitations and leg swelling.  Gastrointestinal: Negative for abdominal pain, constipation, diarrhea, nausea and vomiting.  Genitourinary: Negative for dysuria, flank pain and frequency.  Musculoskeletal: Positive for myalgias. Negative for back pain, neck pain and neck stiffness.  Skin: Negative for rash and wound.  Neurological: Negative for light-headedness, numbness and headaches.  Psychiatric/Behavioral: Negative for agitation.  All other systems reviewed and are negative.    Physical Exam Updated Vital Signs BP (!) 162/95 (BP Location: Left Arm)   Pulse 66   Temp 98.2 F (36.8 C) (Oral)   Resp 18   Ht 5\' 4"  (1.626 m)   Wt 66 kg   LMP 08/20/2018 (Exact Date)   SpO2 100%   BMI 24.98 kg/m   Physical Exam  Constitutional: She is oriented to person, place, and time. She appears well-developed and well-nourished. No distress.  HENT:  Head: Normocephalic.  Nose: Rhinorrhea present.  Mouth/Throat: Oropharynx is clear and moist. No oropharyngeal exudate.  Eyes: Pupils are equal, round, and reactive to light. Conjunctivae and EOM are normal.  Neck: Normal range of motion.  Cardiovascular: Normal rate and intact distal pulses.  No murmur heard. Pulmonary/Chest: Effort normal and breath sounds normal. No stridor. No respiratory distress. She has no wheezes. She exhibits no tenderness.  Abdominal: Soft. Bowel sounds  are normal. She exhibits no distension. There is no tenderness. There is no rebound.  Musculoskeletal: She exhibits no edema or tenderness.  Lymphadenopathy:    She has no cervical adenopathy.  Neurological: She is alert and oriented to person, place, and time. No sensory deficit. She exhibits normal muscle tone.  Skin: Skin is warm. Capillary refill takes less than 2 seconds. No rash noted. She is not diaphoretic. No  erythema.  Psychiatric: She has a normal mood and affect.  Nursing note and vitals reviewed.    ED Treatments / Results  Labs (all labs ordered are listed, but only abnormal results are displayed) Labs Reviewed  URINALYSIS, ROUTINE W REFLEX MICROSCOPIC - Abnormal; Notable for the following components:      Result Value   Hgb urine dipstick MODERATE (*)    Nitrite POSITIVE (*)    All other components within normal limits  URINALYSIS, MICROSCOPIC (REFLEX) - Abnormal; Notable for the following components:   Bacteria, UA MANY (*)    All other components within normal limits  URINE CULTURE  CBC WITH DIFFERENTIAL/PLATELET  COMPREHENSIVE METABOLIC PANEL  MAGNESIUM  PREGNANCY, URINE    EKG EKG Interpretation  Date/Time:  Friday September 01 2018 12:35:33 EST Ventricular Rate:  58 PR Interval:    QRS Duration: 77 QT Interval:  458 QTC Calculation: 450 R Axis:   62 Text Interpretation:  Sinus rhythm Consider left ventricular hypertrophy When compared to prior, no significant changes seen.  No STEMI Confirmed by Theda Belfast ([id number]) on 09/01/2018 12:37:46 PM   Radiology Dg Chest 2 View  Result Date: 09/01/2018 CLINICAL DATA:  Cough, chest congestion for several weeks. Current smoker. EXAM: CHEST - 2 VIEW COMPARISON:  PA and lateral chest x-ray of April 30, 2018 FINDINGS: The lungs are mildly hyperinflated and clear. The heart and pulmonary vascularity are normal. The mediastinum is normal in width. There is no pleural effusion. The bony thorax is unremarkable. IMPRESSION: Mild hyperinflation consistent with COPD or reactive airway disease. No pneumonia, CHF, nor other acute cardiopulmonary abnormality. Electronically Signed   By: David  [place] M.D.   On: 09/01/2018 12:37    Procedures Procedures (including critical care time)  Medications Ordered in ED Medications  nitrofurantoin (macrocrystal-monohydrate) (MACROBID) capsule 100 mg (has no administration in time range)    ondansetron (ZOFRAN) injection 4 mg (4 mg Intravenous Given 09/01/18 1213)  prochlorperazine (COMPAZINE) injection 10 mg (10 mg Intravenous Given 09/01/18 1238)     Initial Impression / Assessment and Plan / ED Course  I have reviewed the triage vital signs and the nursing notes.  Pertinent labs & imaging results that were available during my care of the patient were reviewed by me and considered in my medical decision making (see chart for details).     Maria Wright is a 47 y.o. female with a past medical history significant for symptom medic anemia several months ago currently on iron who presents with malaise, chills, cough, occasional shortness of breath, nausea, constipation, and concern for elevated blood pressure.  Patient says that she is been feeling bad for the last 2 weeks and has had continued dry cough.  She says that she took her blood pressure when she was feeling worse yesterday and was found to have a blood pressure of around 167.  Patient says she is never had a history of high blood pressure problems.  She talked to her mother who recommended she come to the emergency department evaluation.  On arrival, patient says she still  having chills and malaise.  She has not taken her temperature and has not had a fever at home.  She has had no urinary symptoms but has had nausea.  No vomiting.  She reports chronic constipation in the setting of her iron pills.  No diarrhea.  No chest pain or palpitations.  He does report the shortness of breath with the cough.  She reports some mild headache with photophobia.  She reports some lightheadedness but no syncopal episodes or room spinning dizziness.  On arrival, blood pressure was 162 systolic.  On exam, no focal neurologic deficit seen.  Lungs are clear and chest is nontender.  Abdomen is nontender.  Legs are nonedematous.  Oropharyngeal exam unremarkable.  Patient had some congestion that was appreciated on exam.  Neck was not stiff and  not tender.  No rashes seen.  Clinically I suspect patient has a viral URI contributing to her malaise and symptoms.  In the setting of this I suspect her blood pressure is elevated.  Due to the 2 weeks of symptoms, patient will have chest x-ray to look for pneumonia, urinalysis to look for UTI and some screening blood work.  Anticipate reassessment after work-up.  She was given nausea medicine for nausea.       We discussed how isolated elevated blood pressures in the setting of other symptoms such as URI may not mean that she has chronic hypertension.  Patient will likely need to be seen by PCP in follow-up to determine if she has persistent elevated blood pressures.  1:43 PM Patient's laboratory testing showed no evidence of ab normality with CBC or CMP.  Urinalysis shows nitrites and bacteria.  Suspect UTI.  Due to allergy to penicillins, patient given prescription and a dose of nitrofurantoin to help clear this.  Pregnancy is negative.  Culture was sent.  Chest x-ray shows evidence of possible COPD but no pneumonia.  Patient was feeling better after the Compazine for headache and nausea.  Blood pressure improved into the 150s.    In the setting of acute infection and with improvement of blood pressure with headache management, do not feel comfortable starting patient on any blood pressure medicine until all other abnormalities have been treated.  Patient had no focal neurologic deficits and patient had normal gait walking to the bathroom.    Patient will follow with a PCP for further blood pressure monitoring and for reassessment.  Patient understood return precautions for any new or worsened symptoms.    Patient with questions or concerns and was discharged in good condition.  Final Clinical Impressions(s) / ED Diagnoses   Final diagnoses:  Acute cystitis without hematuria  Upper respiratory tract infection, unspecified type  Elevated BP without diagnosis of hypertension    ED  Discharge Orders         Ordered    nitrofurantoin, macrocrystal-monohydrate, (MACROBID) 100 MG capsule  2 times daily     09/01/18 1340    prochlorperazine (COMPAZINE) 10 MG tablet  2 times daily PRN     09/01/18 1340         Clinical Impression: 1. Acute cystitis without hematuria   2. Upper respiratory tract infection, unspecified type   3. Elevated BP without diagnosis of hypertension     Disposition: Discharge  Condition: Good  I have discussed the results, Dx and Tx plan with the pt(& family if present). He/she/they expressed understanding and agree(s) with the plan. Discharge instructions discussed at great length. Strict return precautions discussed and  pt &/or family have verbalized understanding of the instructions. No further questions at time of discharge.    New Prescriptions   NITROFURANTOIN, MACROCRYSTAL-MONOHYDRATE, (MACROBID) 100 MG CAPSULE    Take 1 capsule (100 mg total) by mouth 2 (two) times daily.   PROCHLORPERAZINE (COMPAZINE) 10 MG TABLET    Take 1 tablet (10 mg total) by mouth 2 (two) times daily as needed for nausea or vomiting.    Follow Up: [facility] [address] [place] [address] [phone] Schedule an appointment as soon as possible for a visit    [facility] [place]GENCY DEPARTMENT [address] [id number] [address] [place] [address] [phone]       Sedalia Greeson, Canary Brim, MD 09/01/18 1344

## 2018-09-01 NOTE — Discharge Instructions (Signed)
Please follow-up with a primary care physician to have your blood pressure reexamined.  We found evidence of urinary tract infection today and I suspect this is contributing to your symptoms.  Also for likely viral infection causing you to have the malaise, cough, and nausea symptoms.  Your blood pressure improved after you are feeling better, please use the nausea medicine to help with your headache and nausea.  If any symptoms change or worsen, please return to the nearest emergency department.  Please stay hydrated.

## 2018-09-01 NOTE — ED Notes (Signed)
Pt in X-Ray ?

## 2018-09-03 LAB — URINE CULTURE

## 2018-09-04 ENCOUNTER — Telehealth: Payer: Self-pay | Admitting: *Deleted

## 2018-09-04 NOTE — Telephone Encounter (Signed)
Post ED Visit - Positive Culture Follow-up  Culture report reviewed by antimicrobial stewardship pharmacist:  []  Maria Wright, Pharm.D. []  Maria Wright, Pharm.D., BCPS AQ-ID []  Maria Wright, Pharm.D., BCPS []  Maria Wright, Pharm.D., BCPS []  Maria Wright, [address]Pharm[address]D., BCPS, AAHIVP []  Maria Wright, Pharm.D., BCPS, AAHIVP [x]  Maria Wright, PharmD, BCPS []  Maria Wright, PharmD, BCPS []  Maria Wright, PharmD, BCPS []  Maria Wright, PharmD  Positive urine culture Treated with Nitrofurantoin Monohyd Macro organism sensitive to the same and no further patient follow-up is required at this time.  Maria Wright, Maria Wright [facility] 09/04/2018, 11:34 AM

## 2019-07-30 IMAGING — CR DG CHEST 2V
2 series · 2 of 2 positions shown · non-contrast
Comparison: PA and lateral chest x-ray April 30, 2018

CLINICAL DATA: Cough, chest congestion for several weeks. Current
smoker.

EXAM:
CHEST - 2 VIEW

[w chest pa]
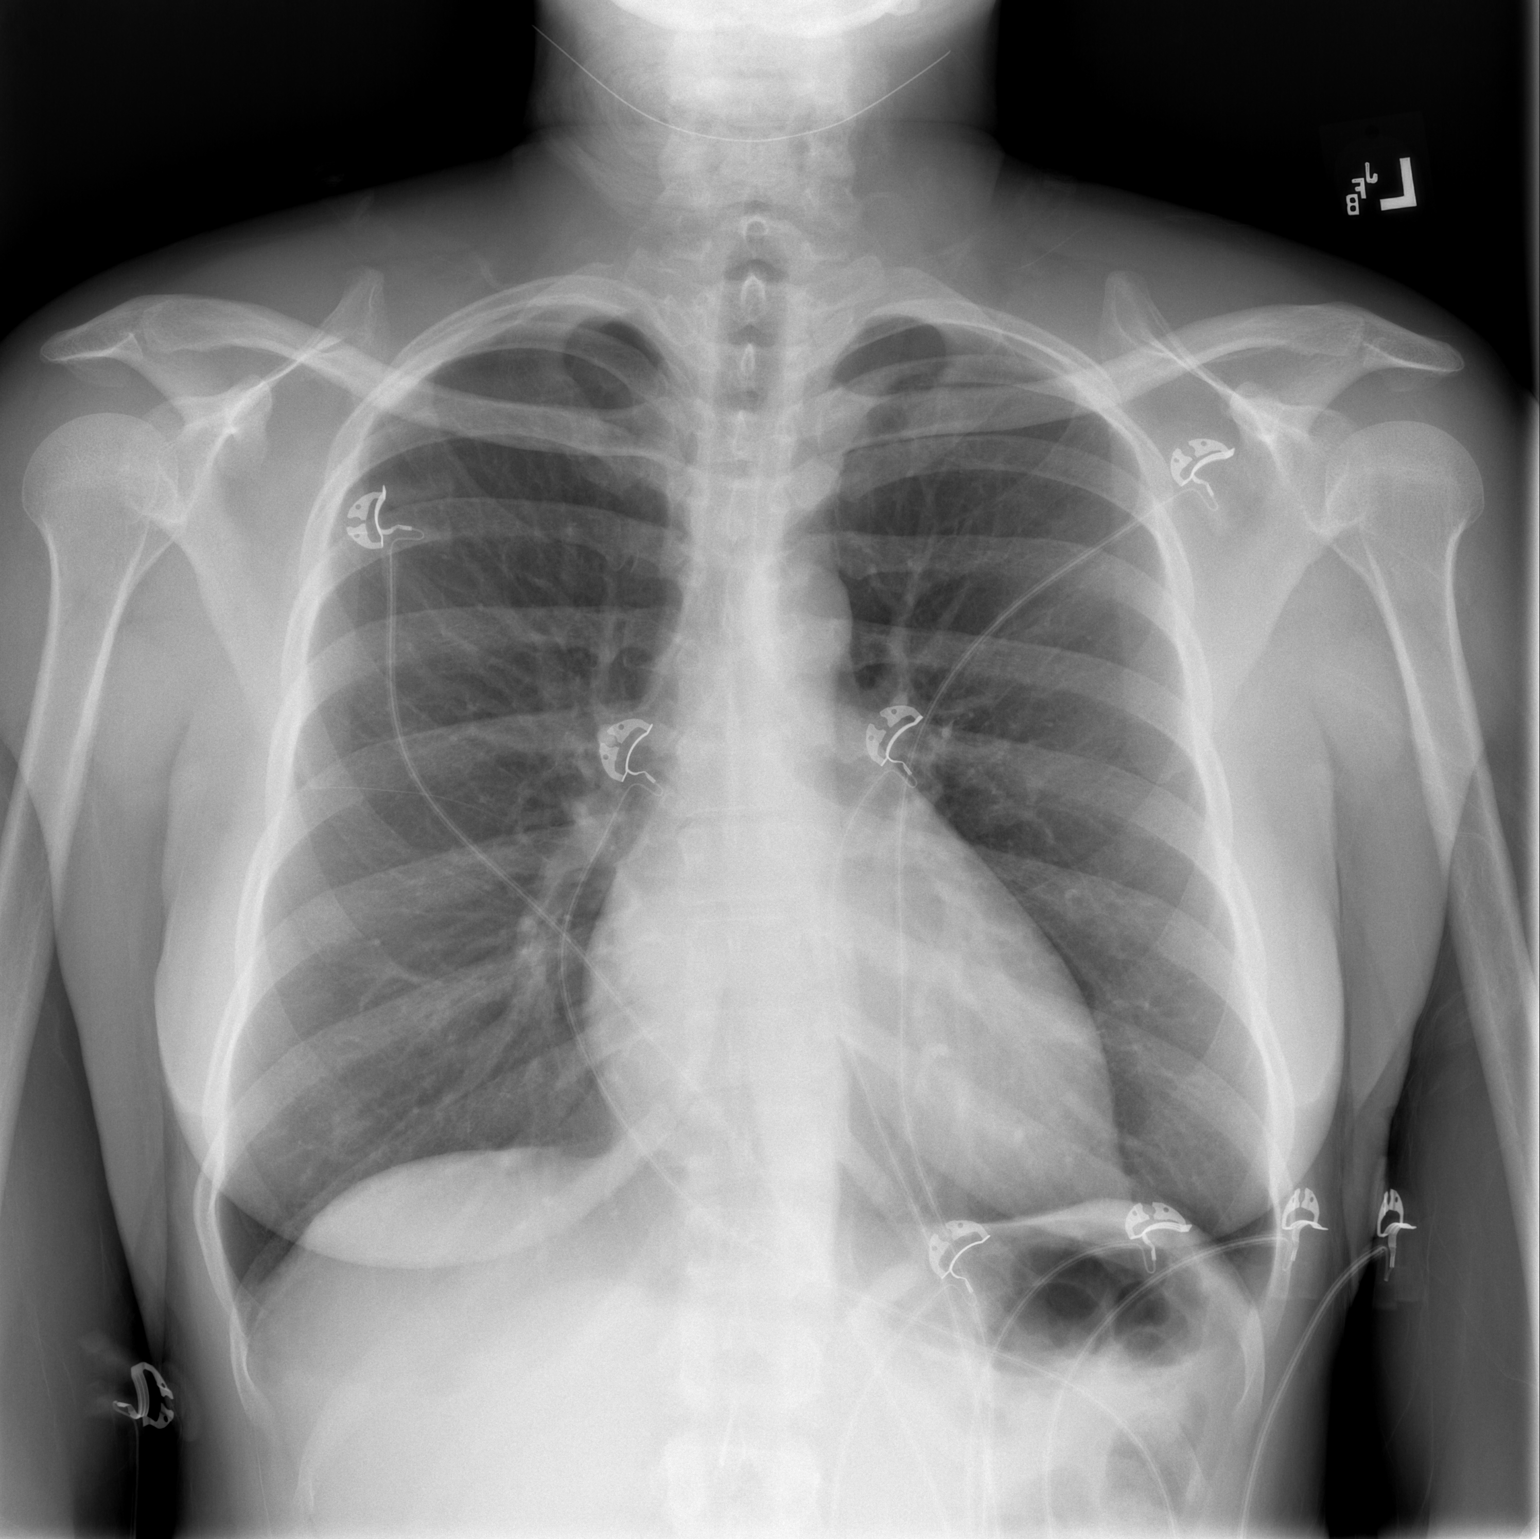

[w chest lat]
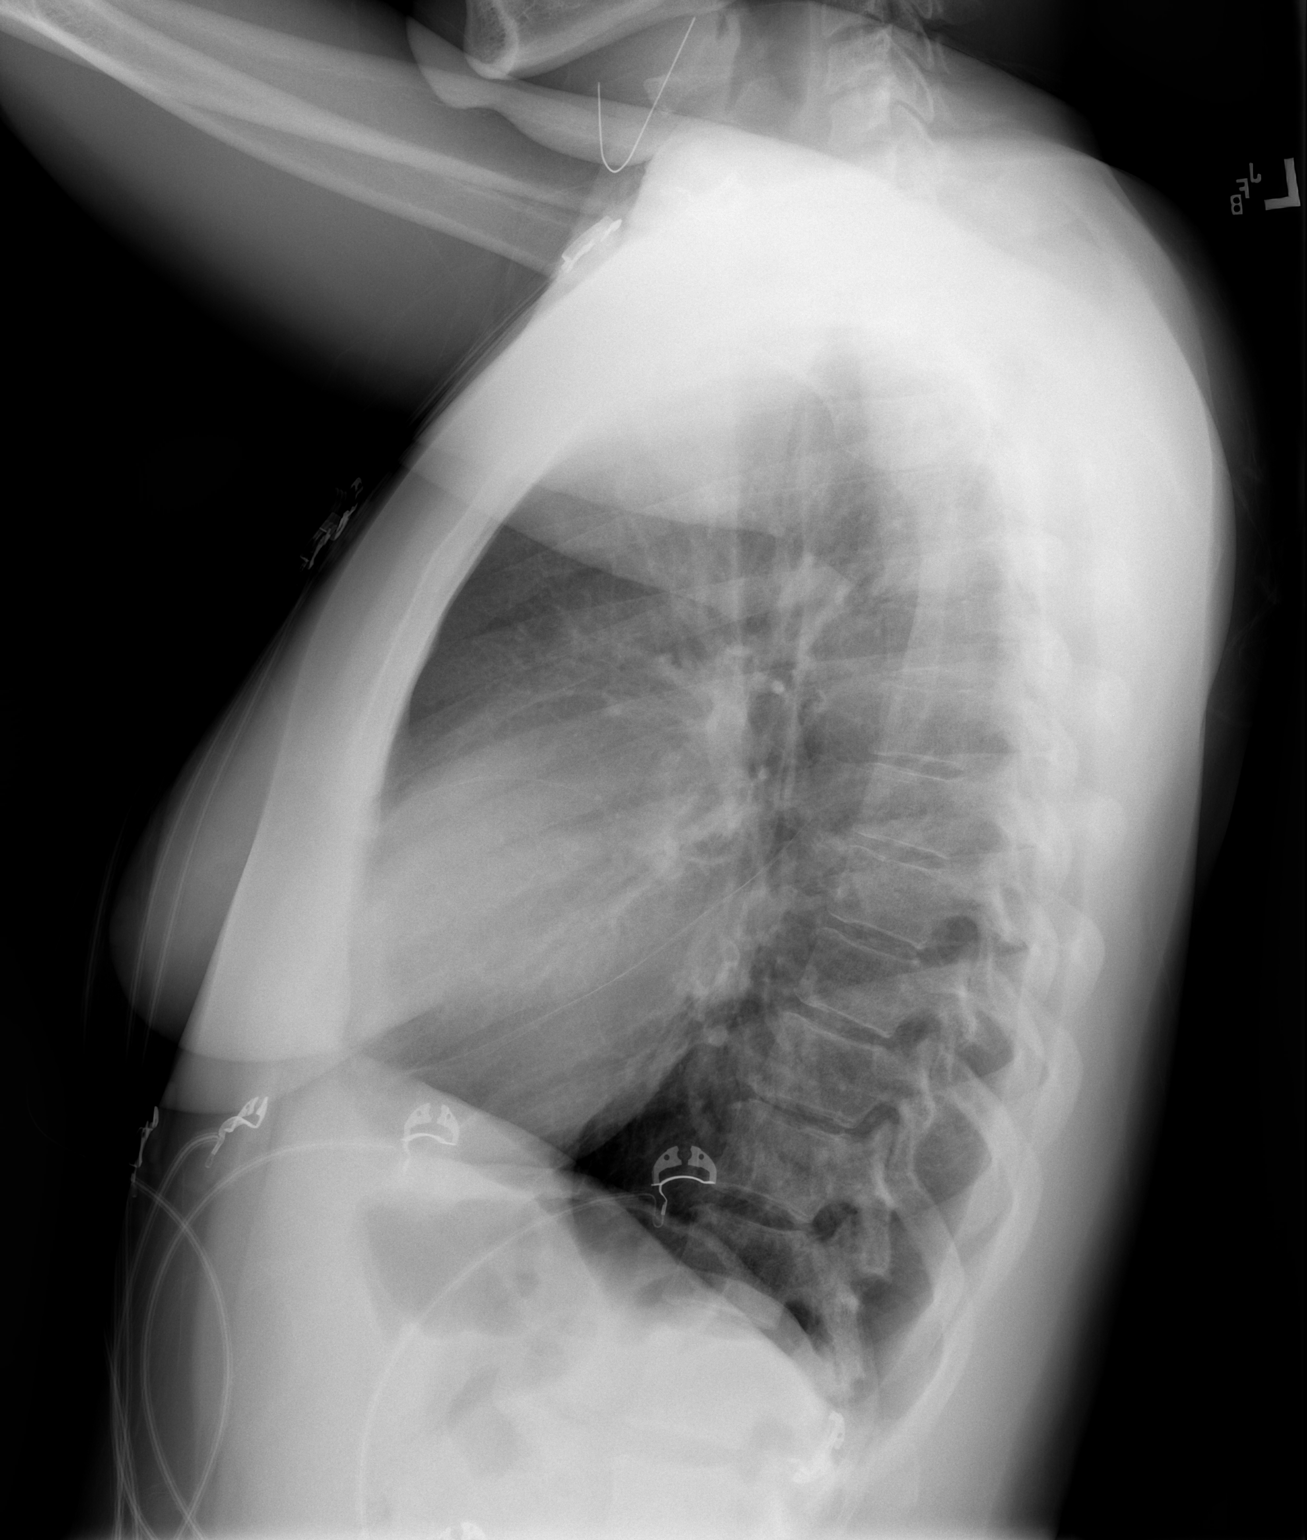

[2 of 2 positions shown; findings below may reference images not displayed]

FINDINGS: The lungs are mildly hyperinflated and clear. The heart and
pulmonary vascularity are normal. The mediastinum is normal in
width. There is no pleural effusion. The bony thorax is
unremarkable.
IMPRESSION: Mild hyperinflation consistent with COPD or reactive airway disease.
No pneumonia, CHF, nor other acute cardiopulmonary abnormality.

## 2024-03-22 ENCOUNTER — Other Ambulatory Visit: Payer: Self-pay

## 2024-03-22 ENCOUNTER — Emergency Department (HOSPITAL_COMMUNITY)

## 2024-03-22 ENCOUNTER — Emergency Department (HOSPITAL_COMMUNITY)
Admission: EM | Admit: 2024-03-22 | Discharge: 2024-03-22 | Disposition: A | Discharge status: Home / Self Care | Attending: Emergency Medicine | Admitting: Emergency Medicine

## 2024-03-22 DIAGNOSIS — R1013 Epigastric pain: Secondary | ICD-10-CM

## 2024-03-22 DIAGNOSIS — K2970 Gastritis, unspecified, without bleeding: Secondary | ICD-10-CM | POA: Diagnosis not present

## 2024-03-22 DIAGNOSIS — K8020 Calculus of gallbladder without cholecystitis without obstruction: Secondary | ICD-10-CM | POA: Diagnosis not present

## 2024-03-22 DIAGNOSIS — R200 Anesthesia of skin: Secondary | ICD-10-CM | POA: Diagnosis not present

## 2024-03-22 LAB — URINALYSIS, ROUTINE W REFLEX MICROSCOPIC
Bilirubin Urine: NEGATIVE
Glucose, UA: NEGATIVE mg/dL
Ketones, ur: NEGATIVE mg/dL
Leukocytes,Ua: NEGATIVE
Nitrite: NEGATIVE
Protein, ur: NEGATIVE mg/dL
Specific Gravity, Urine: <1.005 — ABNORMAL LOW (ref 1.005–1.030)
pH: 7.5 (ref 5.0–8.0)

## 2024-03-22 LAB — COMPREHENSIVE METABOLIC PANEL WITH GFR
ALT: 13 U/L (ref 0–44)
AST: 20 U/L (ref 15–41)
Albumin: 3.9 g/dL (ref 3.5–5.0)
Alkaline Phosphatase: 61 U/L (ref 38–126)
Anion gap: 11 (ref 5–15)
BUN: 15 mg/dL (ref 6–20)
CO2: 19 mmol/L — ABNORMAL LOW (ref 22–32)
Calcium: 9.5 mg/dL (ref 8.9–10.3)
Chloride: 111 mmol/L (ref 98–111)
Creatinine, Ser: 1.13 mg/dL — ABNORMAL HIGH (ref 0.44–1.00)
GFR, Estimated: 58 mL/min — ABNORMAL LOW (ref >60)
Glucose, Bld: 97 mg/dL (ref 70–99)
Potassium: 3.7 mmol/L (ref 3.5–5.1)
Sodium: 141 mmol/L (ref 135–145)
Total Bilirubin: 1 mg/dL (ref 0.0–1.2)
Total Protein: 7 g/dL (ref 6.5–8.1)

## 2024-03-22 LAB — CBC
HCT: 29.2 % — ABNORMAL LOW (ref 36.0–46.0)
Hemoglobin: 10 g/dL — ABNORMAL LOW (ref 12.0–15.0)
MCH: 38.2 pg — ABNORMAL HIGH (ref 26.0–34.0)
MCHC: 34.2 g/dL (ref 30.0–36.0)
MCV: 111.5 fL — ABNORMAL HIGH (ref 80.0–100.0)
Platelets: 196 10*3/uL (ref 150–400)
RBC: 2.62 MIL/uL — ABNORMAL LOW (ref 3.87–5.11)
RDW: 17.2 % — ABNORMAL HIGH (ref 11.5–15.5)
WBC: 7.7 10*3/uL (ref 4.0–10.5)
nRBC: 0.4 % — ABNORMAL HIGH (ref 0.0–0.2)

## 2024-03-22 LAB — D-DIMER, QUANTITATIVE: D-Dimer, Quant: 0.32 ug{FEU}/mL (ref 0.00–0.50)

## 2024-03-22 LAB — URINALYSIS, MICROSCOPIC (REFLEX): Bacteria, UA: NONE SEEN

## 2024-03-22 LAB — LIPASE, BLOOD: Lipase: 26 U/L (ref 11–51)

## 2024-03-22 MED ORDER — MORPHINE SULFATE (PF) 2 MG/ML IV SOLN
2.0000 mg | Freq: Once | INTRAVENOUS | Status: AC
  Administered 2024-03-22: 2 mg via INTRAVENOUS
  Filled 2024-03-22: qty 1

## 2024-03-22 MED ORDER — ONDANSETRON HCL 4 MG/2ML IJ SOLN
4.0000 mg | Freq: Once | INTRAMUSCULAR | Status: AC
  Administered 2024-03-22: 4 mg via INTRAVENOUS
  Filled 2024-03-22: qty 2

## 2024-03-22 MED ORDER — SUCRALFATE 1 G PO TABS
1.0000 g | ORAL_TABLET | Freq: Three times a day (TID) | ORAL | 0 refills | Status: AC
Start: 2024-03-22 — End: ?

## 2024-03-22 MED ORDER — LACTATED RINGERS IV BOLUS
1000.0000 mL | Freq: Once | INTRAVENOUS | Status: AC
  Administered 2024-03-22: 1000 mL via INTRAVENOUS

## 2024-03-22 MED ORDER — SUCRALFATE 1 G PO TABS
1.0000 g | ORAL_TABLET | Freq: Once | ORAL | Status: AC
  Administered 2024-03-22: 1 g via ORAL
  Filled 2024-03-22: qty 1

## 2024-03-22 MED ORDER — IOHEXOL 350 MG/ML SOLN
75.0000 mL | Freq: Once | INTRAVENOUS | Status: AC | PRN
Start: 2024-03-22 — End: 2024-03-22
  Administered 2024-03-22: 75 mL via INTRAVENOUS

## 2024-03-22 MED ORDER — OXYCODONE-ACETAMINOPHEN 5-325 MG PO TABS: 2.0000 | ORAL_TABLET | Freq: Once | ORAL | Status: DC

## 2024-03-22 MED ORDER — OXYCODONE-ACETAMINOPHEN 5-325 MG PO TABS
2.0000 | ORAL_TABLET | Freq: Once | ORAL | Status: AC
  Administered 2024-03-22: 2 via ORAL
  Filled 2024-03-22: qty 2

## 2024-03-22 NOTE — ED Triage Notes (Signed)
 Pt. Stated, Im having pain under my breast and numbness and nausea that started a month ago. Ive been to UC x twice. Ive been waiting to see an orthopaedic for the numbness in my hands.

## 2024-03-22 NOTE — ED Provider Notes (Signed)
 [place] EMERGENCY DEPARTMENT AT [facility] Provider Note   CSN: [id number] Arrival date & time: 03/22/24  4098     History  Chief Complaint  Patient presents with   pain under breast   Numbness   Nausea    Maria Wright is a 53 y.o. female.  HPI 53 year old female presents today complaining of epigastric to left upper abdominal pain.  She states she has had pain off and on for several weeks but this got worse during the night into the early morning hours.  She also reports that she feels like both of her hands are swelling.  She has been referred to orthopedic surgery but has had her appointments canceled twice.  She reports that she does not work but she does do some activities with her hands that require repeated wrist flexion and extension.  She has had some nausea and vomiting.  Symptoms began after she ate food last night.  However she does not clearly have symptoms that are connected to food intake.  She has not had fever or chills or UTI symptoms.  She denies any similar symptoms in the past.    Home Medications Prior to Admission medications   Medication Sig Start Date End Date Taking? Authorizing Provider  sucralfate (CARAFATE) 1 g tablet Take 1 tablet (1 g total) by mouth 4 (four) times daily -  with meals and at bedtime. 03/22/24  Yes Auston Blush, MD  ferrous sulfate  325 (65 FE) MG tablet Take 1 tablet (325 mg total) by mouth 2 (two) times daily with a meal. 05/01/18   Daren Eck, DO  nitrofurantoin , macrocrystal-monohydrate, (MACROBID ) 100 MG capsule Take 1 capsule (100 mg total) by mouth 2 (two) times daily. 09/01/18   Tegeler, Marine Sia, MD  prochlorperazine  (COMPAZINE ) 10 MG tablet Take 1 tablet (10 mg total) by mouth 2 (two) times daily as needed for nausea or vomiting. 09/01/18   Tegeler, Marine Sia, MD      Allergies    Penicillins    Review of Systems   Review of Systems  Physical Exam Updated Vital Signs BP 131/85   Pulse 66   Temp  98.9 F (37.2 C) (Oral)   Resp 12   Ht 1.626 m (5\' 4" )   Wt 72.6 kg   LMP  (LMP Unknown)   SpO2 99%   BMI 27.46 kg/m  Physical Exam Vitals reviewed.  Constitutional:      Appearance: Normal appearance.  HENT:     Head: Normocephalic.     Right Ear: External ear normal.     Left Ear: External ear normal.     Nose: Nose normal.     Mouth/Throat:     Pharynx: Oropharynx is clear.  Eyes:     Extraocular Movements: Extraocular movements intact.     Pupils: Pupils are equal, round, and reactive to light.  Cardiovascular:     Rate and Rhythm: Normal rate and regular rhythm.     Pulses: Normal pulses.     Heart sounds: Normal heart sounds.  Pulmonary:     Effort: Pulmonary effort is normal.     Breath sounds: Normal breath sounds.  Abdominal:     General: Abdomen is flat. There is no distension.     Tenderness: There is abdominal tenderness.     Comments: Epigastric tenderness with some tenderness to the right upper quadrant  Musculoskeletal:        General: No swelling, tenderness, deformity or signs of injury.  Cervical back: Normal range of motion.     Right lower leg: No edema.     Left lower leg: No edema.     Comments: Positive Tinel test at bilateral wrists  Skin:    General: Skin is warm and dry.     Capillary Refill: Capillary refill takes less than 2 seconds.  Neurological:     General: No focal deficit present.     Mental Status: She is alert.  Psychiatric:        Mood and Affect: Mood normal.     ED Results / Procedures / Treatments   Labs (all labs ordered are listed, but only abnormal results are displayed) Labs Reviewed  URINALYSIS, ROUTINE W REFLEX MICROSCOPIC - Abnormal; Notable for the following components:      Result Value   Specific Gravity, Urine <1.005 (*)    Hgb urine dipstick SMALL (*)    All other components within normal limits  CBC - Abnormal; Notable for the following components:   RBC 2.62 (*)    Hemoglobin 10.0 (*)    HCT 29.2  (*)    MCV 111.5 (*)    MCH 38.2 (*)    RDW 17.2 (*)    nRBC 0.4 (*)    All other components within normal limits  COMPREHENSIVE METABOLIC PANEL WITH GFR - Abnormal; Notable for the following components:   CO2 19 (*)    Creatinine, Ser 1.13 (*)    GFR, Estimated 58 (*)    All other components within normal limits  D-DIMER, QUANTITATIVE  LIPASE, BLOOD  URINALYSIS, MICROSCOPIC (REFLEX)    EKG None  Radiology US  Abdomen Limited Result Date: 03/22/2024 CLINICAL DATA:  upper abdominal pain EXAM: ULTRASOUND ABDOMEN LIMITED RIGHT UPPER QUADRANT COMPARISON:  March 22, 2024 FINDINGS: Gallbladder: A single mobile gallstone present. No wall thickening or pericholecystic fluid. Common bile duct: Diameter: 4 mm Liver: Normal echogenicity. No focal lesion identified. No intrahepatic biliary ductal dilation. Portal vein is patent on color Doppler imaging with normal direction of blood flow towards the liver. Other: The visualized portions of the pancreas are within normal limits. IMPRESSION: Cholecystolithiasis. No sonographic changes of acute cholecystitis. Electronically Signed   By: Rance Burrows M.D.   On: 03/22/2024 15:04   CT ABDOMEN PELVIS W WO CONTRAST Result Date: 03/22/2024 CLINICAL DATA:  Abdominal pain, acute, nonlocalized Abdominal/flank pain, stone suspected Abdominal pain with pain under breast and nausea. EXAM: CT ABDOMEN AND PELVIS WITHOUT AND WITH CONTRAST TECHNIQUE: Multidetector CT imaging of the abdomen and pelvis was performed following the standard protocol before and following the bolus administration of intravenous contrast. RADIATION DOSE REDUCTION: This exam was performed according to the departmental dose-optimization program which includes automated exposure control, adjustment of the mA and/or kV according to patient size and/or use of iterative reconstruction technique. CONTRAST:  75mL OMNIPAQUE IOHEXOL 350 MG/ML SOLN COMPARISON:  Abdominopelvic CT 03/20/2015. FINDINGS: Lower  chest: Probable mild scarring dependently at the right lung base. The visualized lung bases are otherwise clear. No pleural or pericardial effusion. Hepatobiliary: The liver is normal in density without suspicious focal abnormality. Small gallstones are noted. No evidence of gallbladder wall thickening, surrounding inflammation or biliary ductal dilatation. Pancreas: Unremarkable. No pancreatic ductal dilatation or surrounding inflammatory changes. Spleen: Normal in size without focal abnormality. Adrenals/Urinary Tract: Both adrenal glands appear normal. Pre contrast images demonstrate no renal, ureteral or bladder calculi. Following contrast, both kidneys enhance normally. No evidence of enhancing renal mass. There is a small cyst in  the upper pole of the right kidney for which no specific follow-up imaging is recommended. The bladder appears unremarkable for its degree of distention. Stomach/Bowel: No enteric contrast administered. The stomach appears unremarkable for its degree of distension. No evidence of bowel wall thickening, distention or surrounding inflammatory change. The appendix appears normal. Vascular/Lymphatic: There are no enlarged abdominal or pelvic lymph nodes. Aortic and branch vessel atherosclerosis without evidence of aneurysm or large vessel occlusion. Reproductive: The uterus and ovaries appear unremarkable. No adnexal mass. Other: No evidence of abdominal wall mass or hernia. No ascites or pneumoperitoneum. Musculoskeletal: No acute or significant osseous findings. IMPRESSION: 1. No acute findings or explanation for the patient's symptoms. 2. Cholelithiasis without evidence of cholecystitis or biliary ductal dilatation. 3. No evidence of urinary tract calculus or hydronephrosis. 4.  Aortic Atherosclerosis (ICD10-I70.0). Electronically Signed   By: Elmon Hagedorn M.D.   On: 03/22/2024 11:06   DG Chest Port 1 View Result Date: 03/22/2024 CLINICAL DATA:  Epigastric abdominal pain. EXAM:  PORTABLE CHEST 1 VIEW COMPARISON:  September 01, 2018. FINDINGS: The heart size and mediastinal contours are within normal limits. Both lungs are clear. The visualized skeletal structures are unremarkable. IMPRESSION: No active disease. Electronically Signed   By: Rosalene Colon M.D.   On: 03/22/2024 08:50    Procedures Procedures    Medications Ordered in ED Medications  oxyCODONE-acetaminophen  (PERCOCET/ROXICET) 5-325 MG per tablet 2 tablet (0 tablets Oral Hold 03/22/24 1305)  sucralfate (CARAFATE) tablet 1 g (has no administration in time range)  lactated ringers bolus 1,000 mL (0 mLs Intravenous Stopped 03/22/24 1100)  morphine (PF) 2 MG/ML injection 2 mg (2 mg Intravenous Given 03/22/24 0846)  ondansetron  (ZOFRAN ) injection 4 mg (4 mg Intravenous Given 03/22/24 0846)  iohexol (OMNIPAQUE) 350 MG/ML injection 75 mL (75 mLs Intravenous Contrast Given 03/22/24 1043)  oxyCODONE-acetaminophen  (PERCOCET/ROXICET) 5-325 MG per tablet 2 tablet (2 tablets Oral Given 03/22/24 1252)    ED Course/ Medical Decision Making/ A&P                                 Medical Decision Making Amount and/or Complexity of Data Reviewed Labs: ordered. Radiology: ordered.  Risk Prescription drug management.   1-abdominal pain Differential diagnosis includes but is not limited to pancreatitis, gastritis, biliary colic, small bowel obstruction, diverticulitis, renal colic, UTI she has gallstones noted.  CT with gallstones noted without evidence of small bowel obstruction, renal colic, diverticulitis. Pain is more to left but she does have some epigastric to slightly right epigastric pain.  Care discussed with general surgery who has seen.  Abdominal ultrasound pending.  If negative for acute cholecystitis can follow-up in outpatient.  She is requesting food at this time.  We are waiting for abdominal ultrasound results first 2 patient feels like her hands are swollen bilaterally.  On my exam I do not see any obvious  swelling.  They are neurovascularly intact.  She does do some repetitive movement and has some tingling sensations.  Tinel test is positive and suspect may have some component of carpal tunnel syndrome.  Given that it is bilateral, the right hand has more symptoms.  Will place splint on left hand and have follow-up with Ortho hand        Final Clinical Impression(s) / ED Diagnoses Final diagnoses:  Epigastric pain  Calculus of gallbladder without cholecystitis without obstruction  Gastritis, presence of bleeding unspecified, unspecified chronicity, unspecified gastritis  type    Rx / DC Orders ED Discharge Orders          Ordered    sucralfate (CARAFATE) 1 g tablet  3 times daily with meals & bedtime        03/22/24 1521              Auston Blush, MD 03/22/24 1521

## 2024-03-22 NOTE — Consult Note (Signed)
 Consult Note  Maria Wright Mar 11, 1971  [id number].    Requesting MD: Auston Blush, MD Chief Complaint/Reason for Consult: Epigastric abdominal pain and cholelithiasis  HPI:  Patient is a 53 year old female who presented to the ED today with severe epigastric abdominal pain that has been going on intermittently for a few months and radiates across her upper abdomen along where her bra would sit. She reports some associated nausea but no vomiting today. Symptoms seem to be exacerbated by eating sometimes and smoking cigarettes. Pain seems to come on immediately after eating and is sometimes relieved slightly by TUMS. She has seen her PCP and was started on omeprazole which she is taking daily. She did just finish a course of oral prednisone for her carpal tunnel syndrome and felt like pain has been worse since taking these. She had a positive hemoccult in 08/2023 but has not seen GI yet - she thinks she has an appointment scheduled. She has never had an upper endoscopy. Allergy to PCNs, not on any blood thinners. Her husband is with her in the ED.   ROS: Negative other than HPI  Family History  Problem Relation Age of Onset   Iron deficiency Mother     Past Medical History:  Diagnosis Date   Anemia    Irregular periods     No past surgical history on file.  Social History:  reports that she has been smoking. She has never used smokeless tobacco. She reports that she does not currently use alcohol. She reports current drug use. Drug: Marijuana.  Allergies:  Allergies  Allergen Reactions   Penicillins Hives    Has patient had a PCN reaction causing immediate rash, facial/tongue/throat swelling, SOB or lightheadedness with hypotension: Yes Has patient had a PCN reaction causing severe rash involving mucus membranes or skin necrosis: Yes Has patient had a PCN reaction that required hospitalization: No Has patient had a PCN reaction occurring within the last 10 years: No If  all of the above answers are "NO", then may proceed with Cephalosporin use.     (Not in a hospital admission)   Blood pressure 131/85, pulse 66, temperature 98.9 F (37.2 C), temperature source Oral, resp. rate 12, height 5\' 4"  (1.626 m), weight 72.6 kg, SpO2 99%. Physical Exam:  General: pleasant, WD, WN female who is laying in bed and appears uncomfortable HEENT: head is normocephalic, atraumatic.  Sclera are anicteric, pupils equal and round. Ears and nose without any masses or lesions.  Mouth is pink and moist Heart: regular, rate, and rhythm.   Lungs: No wheezes, rhonchi, or rales noted.  Respiratory effort nonlabored Abd: soft, TTP in epigastric abdomen, no positive murphy sign, non distended, no palpable masses or oganomegaly MS: wrist brace to UE Skin: warm and dry with no masses, lesions, or rashes Neuro: Cranial nerves 2-12 grossly intact, sensation is normal throughout Psych: A&Ox3 with an appropriate affect.   Results for orders placed or performed during the hospital encounter of 03/22/24 (from the past 48 hours)  CBC     Status: Abnormal   Collection Time: 03/22/24  8:50 AM  Result Value Ref Range   WBC 7.7 4.0 - 10.5 K/uL   RBC 2.62 (L) 3.87 - 5.11 MIL/uL   Hemoglobin 10.0 (L) 12.0 - 15.0 g/dL   HCT 36.6 (L) 44.0 - 34.7 %   MCV 111.5 (H) 80.0 - 100.0 fL   MCH 38.2 (H) 26.0 - 34.0 pg   MCHC 34.2 30.0 -  36.0 g/dL   RDW 21.3 (H) 08.6 - 57.8 %   Platelets 196 150 - 400 K/uL   nRBC 0.4 (H) 0.0 - 0.2 %    Comment: Performed at [facility], [address], [place], [place] [address]  Comprehensive metabolic panel     Status: Abnormal   Collection Time: 03/22/24  8:50 AM  Result Value Ref Range   Sodium 141 135 - 145 mmol/L   Potassium 3.7 3.5 - 5.1 mmol/L   Chloride 111 98 - 111 mmol/L   CO2 19 (L) 22 - 32 mmol/L   Glucose, Bld 97 70 - 99 mg/dL    Comment: Glucose reference range applies only to samples taken after fasting for at least 8 hours.   BUN 15 6 -  20 mg/dL   Creatinine, Ser 9.52 (H) 0.44 - 1.00 mg/dL   Calcium 9.5 8.9 - 84.1 mg/dL   Total Protein 7.0 6.5 - 8.1 g/dL   Albumin 3.9 3.5 - 5.0 g/dL   AST 20 15 - 41 U/L   ALT 13 0 - 44 U/L   Alkaline Phosphatase 61 38 - 126 U/L   Total Bilirubin 1.0 0.0 - 1.2 mg/dL   GFR, Estimated 58 (L) >60 mL/min    Comment: (NOTE) Calculated using the CKD-EPI Creatinine Equation (2021)    Anion gap 11 5 - 15    Comment: Performed at [facility], [address] [place], [place] [address]  D-dimer, quantitative     Status: None   Collection Time: 03/22/24  8:50 AM  Result Value Ref Range   D-Dimer, Quant 0.32 0.00 - 0.50 ug/mL-FEU    Comment: (NOTE) At the manufacturer cut-off value of 0.5 g/mL FEU, this assay has a negative predictive value of 95-100%.This assay is intended for use in conjunction with a clinical pretest probability (PTP) assessment model to exclude pulmonary embolism (PE) and deep venous thrombosis (DVT) in outpatients suspected of PE or DVT. Results should be correlated with clinical presentation. Performed at [facility], [address] [place], [place] [address]   Lipase, blood     Status: None   Collection Time: 03/22/24  8:50 AM  Result Value Ref Range   Lipase 26 11 - 51 U/L    Comment: Performed at [facility], [address], [place], [place] [address]  Urinalysis, Routine w reflex microscopic -Urine, Clean Catch     Status: Abnormal   Collection Time: 03/22/24  9:06 AM  Result Value Ref Range   Color, Urine YELLOW YELLOW   APPearance CLEAR CLEAR   Specific Gravity, Urine <1.005 (L) 1.005 - 1.030   pH 7.5 5.0 - 8.0   Glucose, UA NEGATIVE NEGATIVE mg/dL   Hgb urine dipstick SMALL (A) NEGATIVE   Bilirubin Urine NEGATIVE NEGATIVE   Ketones, ur NEGATIVE NEGATIVE mg/dL   Protein, ur NEGATIVE NEGATIVE mg/dL   Nitrite NEGATIVE NEGATIVE   Leukocytes,Ua NEGATIVE NEGATIVE    Comment: Performed at [facility], [address]  [place], [place] [address]  Urinalysis, Microscopic (reflex)     Status: None   Collection Time: 03/22/24  9:06 AM  Result Value Ref Range   RBC / HPF 0-5 0 - 5 RBC/hpf   WBC, UA 0-5 0 - 5 WBC/hpf   Bacteria, UA NONE SEEN NONE SEEN   Squamous Epithelial / HPF 0-5 0 - 5 /HPF    Comment: Performed at [facility], [address] [place], [place] [address]   CT ABDOMEN PELVIS W WO CONTRAST Result Date: 03/22/2024 CLINICAL DATA:  Abdominal pain, acute, nonlocalized Abdominal/flank pain, stone suspected Abdominal pain with pain under breast and nausea. EXAM: CT ABDOMEN AND PELVIS WITHOUT AND WITH CONTRAST TECHNIQUE: Multidetector CT imaging of the abdomen and pelvis was performed following the standard protocol before and following the bolus administration of intravenous contrast. RADIATION DOSE REDUCTION: This exam was performed according to the departmental dose-optimization program which includes automated exposure control, adjustment of the mA and/or kV according to patient size and/or use of iterative reconstruction technique. CONTRAST:  75mL OMNIPAQUE IOHEXOL 350 MG/ML SOLN COMPARISON:  Abdominopelvic CT 03/20/2015. FINDINGS: Lower chest: Probable mild scarring dependently at the right lung base. The visualized lung bases are otherwise clear. No pleural or pericardial effusion. Hepatobiliary: The liver is normal in density without suspicious focal abnormality. Small gallstones are noted. No evidence of gallbladder wall thickening, surrounding inflammation or biliary ductal dilatation. Pancreas: Unremarkable. No pancreatic ductal dilatation or surrounding inflammatory changes. Spleen: Normal in size without focal abnormality. Adrenals/Urinary Tract: Both adrenal glands appear normal. Pre contrast images demonstrate no renal, ureteral or bladder calculi. Following contrast, both kidneys enhance normally. No evidence of enhancing renal mass. There is a small cyst in the upper pole of the right kidney for  which no specific follow-up imaging is recommended. The bladder appears unremarkable for its degree of distention. Stomach/Bowel: No enteric contrast administered. The stomach appears unremarkable for its degree of distension. No evidence of bowel wall thickening, distention or surrounding inflammatory change. The appendix appears normal. Vascular/Lymphatic: There are no enlarged abdominal or pelvic lymph nodes. Aortic and branch vessel atherosclerosis without evidence of aneurysm or large vessel occlusion. Reproductive: The uterus and ovaries appear unremarkable. No adnexal mass. Other: No evidence of abdominal wall mass or hernia. No ascites or pneumoperitoneum. Musculoskeletal: No acute or significant osseous findings. IMPRESSION: 1. No acute findings or explanation for the patient's symptoms. 2. Cholelithiasis without evidence of cholecystitis or biliary ductal dilatation. 3. No evidence of urinary tract calculus or hydronephrosis. 4.  Aortic Atherosclerosis (ICD10-I70.0). Electronically Signed   By: Elmon Hagedorn M.D.   On: 03/22/2024 11:06   DG Chest Port 1 View Result Date: 03/22/2024 CLINICAL DATA:  Epigastric abdominal pain. EXAM: PORTABLE CHEST 1 VIEW COMPARISON:  September 01, 2018. FINDINGS: The heart size and mediastinal contours are within normal limits. Both lungs are clear. The visualized skeletal structures are unremarkable. IMPRESSION: No active disease. Electronically Signed   By: Rosalene Colon M.D.   On: 03/22/2024 08:50      Assessment/Plan Epigastric abdominal pain  - CT with cholelithiasis without evidence of cholecystitis or biliary ductal dilation, no renal calculi, no other acute findings - RUQ US  with cholelithiasis no signs of cholecystitis - no leukocytosis and normal LFTs - patient's history with recent steroids causing worsening pain, pain immediately after eating and exacerbation of pain with smoking are all more suggestive of possible peptic ulcer disease rather than  cholecystitis or biliary colic. Also has positive hemoccult from 08/2023. - would recommend GI cocktail and discharge home if pain improved, if not improved consider GI consult  - no acute surgical indications at this time    I reviewed ED provider notes, last 24 h vitals and pain scores, last 48 h intake and output, last 24 h labs and trends, and last 24 h imaging results.  This care required high  level of medical decision making.   Annetta Killian, PA-C  [facility] 03/22/2024, 2:20 PM Please see Amion for pager number during day hours 7:00am-4:30pm

## 2024-03-22 NOTE — ED Notes (Signed)
 Pt understood d/c instructions and when to return to the ED. Medications taught and understood. IV d/c and VS retaken. Pt left w/ husband to go home

## 2024-03-22 NOTE — Discharge Instructions (Addendum)
 You were found to have gallstones on your evaluation today.  There does not appear to be any signs of acute infection.  Please avoid fatty foods.  Call [facility] surgery for outpatient appointment.  Return if you are having worsening or ongoing abdominal pain We are suspicious that your symptoms are coming from some irritation of the lining of the stomach.  Please have the Carafate filled and continue to take your Protonix. Please call the gastroenterologist for outpatient follow-up Is unclear exactly what is causing the pain in your hands although it seems that it may be due to some irritation of the nerve running along the wrist.  This can come from lots of reasons.  You are having a brace placed on 1 wrist to see if this helps.  Please call and follow-up with the hand surgeon.

## 2024-03-24 ENCOUNTER — Inpatient Hospital Stay (HOSPITAL_BASED_OUTPATIENT_CLINIC_OR_DEPARTMENT_OTHER)
Admission: EM | Admit: 2024-03-24 | Discharge: 2024-03-29 | DRG: 392 | Disposition: A | Discharge status: Home Health | Attending: Internal Medicine | Admitting: Internal Medicine

## 2024-03-24 ENCOUNTER — Emergency Department (HOSPITAL_BASED_OUTPATIENT_CLINIC_OR_DEPARTMENT_OTHER)

## 2024-03-24 ENCOUNTER — Encounter (HOSPITAL_BASED_OUTPATIENT_CLINIC_OR_DEPARTMENT_OTHER): Payer: Self-pay

## 2024-03-24 ENCOUNTER — Other Ambulatory Visit: Payer: Self-pay

## 2024-03-24 DIAGNOSIS — R221 Localized swelling, mass and lump, neck: Secondary | ICD-10-CM | POA: Diagnosis present

## 2024-03-24 DIAGNOSIS — Z789 Other specified health status: Secondary | ICD-10-CM | POA: Diagnosis not present

## 2024-03-24 DIAGNOSIS — K3189 Other diseases of stomach and duodenum: Secondary | ICD-10-CM | POA: Diagnosis present

## 2024-03-24 DIAGNOSIS — K5900 Constipation, unspecified: Secondary | ICD-10-CM | POA: Diagnosis present

## 2024-03-24 DIAGNOSIS — D7589 Other specified diseases of blood and blood-forming organs: Secondary | ICD-10-CM | POA: Diagnosis present

## 2024-03-24 DIAGNOSIS — R1013 Epigastric pain: Principal | ICD-10-CM

## 2024-03-24 DIAGNOSIS — G629 Polyneuropathy, unspecified: Secondary | ICD-10-CM | POA: Diagnosis present

## 2024-03-24 DIAGNOSIS — R079 Chest pain, unspecified: Secondary | ICD-10-CM | POA: Diagnosis present

## 2024-03-24 DIAGNOSIS — K2970 Gastritis, unspecified, without bleeding: Principal | ICD-10-CM | POA: Diagnosis present

## 2024-03-24 DIAGNOSIS — M546 Pain in thoracic spine: Secondary | ICD-10-CM | POA: Diagnosis not present

## 2024-03-24 DIAGNOSIS — E538 Deficiency of other specified B group vitamins: Secondary | ICD-10-CM | POA: Diagnosis present

## 2024-03-24 DIAGNOSIS — I10 Essential (primary) hypertension: Secondary | ICD-10-CM | POA: Diagnosis not present

## 2024-03-24 DIAGNOSIS — K1120 Sialoadenitis, unspecified: Secondary | ICD-10-CM | POA: Diagnosis present

## 2024-03-24 DIAGNOSIS — F17200 Nicotine dependence, unspecified, uncomplicated: Secondary | ICD-10-CM | POA: Diagnosis present

## 2024-03-24 DIAGNOSIS — M940 Chondrocostal junction syndrome [Tietze]: Secondary | ICD-10-CM | POA: Diagnosis present

## 2024-03-24 DIAGNOSIS — R1010 Upper abdominal pain, unspecified: Secondary | ICD-10-CM | POA: Diagnosis not present

## 2024-03-24 DIAGNOSIS — R202 Paresthesia of skin: Secondary | ICD-10-CM | POA: Insufficient documentation

## 2024-03-24 DIAGNOSIS — D539 Nutritional anemia, unspecified: Secondary | ICD-10-CM | POA: Diagnosis present

## 2024-03-24 DIAGNOSIS — R109 Unspecified abdominal pain: Secondary | ICD-10-CM | POA: Diagnosis present

## 2024-03-24 DIAGNOSIS — Z880 Allergy status to penicillin: Secondary | ICD-10-CM

## 2024-03-24 DIAGNOSIS — Z79899 Other long term (current) drug therapy: Secondary | ICD-10-CM

## 2024-03-24 DIAGNOSIS — Z79890 Hormone replacement therapy (postmenopausal): Secondary | ICD-10-CM

## 2024-03-24 DIAGNOSIS — E039 Hypothyroidism, unspecified: Secondary | ICD-10-CM | POA: Diagnosis present

## 2024-03-24 LAB — CBC WITH DIFFERENTIAL/PLATELET
Abs Immature Granulocytes: 0.02 10*3/uL (ref 0.00–0.07)
Basophils Absolute: 0 10*3/uL (ref 0.0–0.1)
Basophils Relative: 0 %
Eosinophils Absolute: 0 10*3/uL (ref 0.0–0.5)
Eosinophils Relative: 0 %
HCT: 27.7 % — ABNORMAL LOW (ref 36.0–46.0)
Hemoglobin: 9.8 g/dL — ABNORMAL LOW (ref 12.0–15.0)
Immature Granulocytes: 0 %
Lymphocytes Relative: 36 %
Lymphs Abs: 3.1 10*3/uL (ref 0.7–4.0)
MCH: 38.1 pg — ABNORMAL HIGH (ref 26.0–34.0)
MCHC: 35.4 g/dL (ref 30.0–36.0)
MCV: 107.8 fL — ABNORMAL HIGH (ref 80.0–100.0)
Monocytes Absolute: 0.2 10*3/uL (ref 0.1–1.0)
Monocytes Relative: 2 %
Neutro Abs: 5.2 10*3/uL (ref 1.7–7.7)
Neutrophils Relative %: 62 %
Platelets: 204 10*3/uL (ref 150–400)
RBC: 2.57 MIL/uL — ABNORMAL LOW (ref 3.87–5.11)
RDW: 16.8 % — ABNORMAL HIGH (ref 11.5–15.5)
WBC: 8.6 10*3/uL (ref 4.0–10.5)
nRBC: 0.5 % — ABNORMAL HIGH (ref 0.0–0.2)

## 2024-03-24 LAB — COMPREHENSIVE METABOLIC PANEL WITH GFR
ALT: 15 U/L (ref 0–44)
AST: 24 U/L (ref 15–41)
Albumin: 4.7 g/dL (ref 3.5–5.0)
Alkaline Phosphatase: 82 U/L (ref 38–126)
Anion gap: 18 — ABNORMAL HIGH (ref 5–15)
BUN: 11 mg/dL (ref 6–20)
CO2: 17 mmol/L — ABNORMAL LOW (ref 22–32)
Calcium: 10 mg/dL (ref 8.9–10.3)
Chloride: 107 mmol/L (ref 98–111)
Creatinine, Ser: 1.11 mg/dL — ABNORMAL HIGH (ref 0.44–1.00)
GFR, Estimated: 59 mL/min — ABNORMAL LOW (ref >60)
Glucose, Bld: 108 mg/dL — ABNORMAL HIGH (ref 70–99)
Potassium: 3.6 mmol/L (ref 3.5–5.1)
Sodium: 142 mmol/L (ref 135–145)
Total Bilirubin: 1.2 mg/dL (ref 0.0–1.2)
Total Protein: 7.6 g/dL (ref 6.5–8.1)

## 2024-03-24 LAB — TROPONIN T, HIGH SENSITIVITY
Troponin T High Sensitivity: <15 ng/L (ref <19)
Troponin T High Sensitivity: <15 ng/L (ref <19)

## 2024-03-24 LAB — URINALYSIS, ROUTINE W REFLEX MICROSCOPIC
Bilirubin Urine: NEGATIVE
Glucose, UA: NEGATIVE mg/dL
Ketones, ur: NEGATIVE mg/dL
Leukocytes,Ua: NEGATIVE
Nitrite: NEGATIVE
Protein, ur: NEGATIVE mg/dL
Specific Gravity, Urine: 1.015 (ref 1.005–1.030)
pH: 7.5 (ref 5.0–8.0)

## 2024-03-24 LAB — URINALYSIS, MICROSCOPIC (REFLEX)

## 2024-03-24 LAB — LIPASE, BLOOD: Lipase: 15 U/L (ref 11–51)

## 2024-03-24 LAB — D-DIMER, QUANTITATIVE: D-Dimer, Quant: 0.47 ug{FEU}/mL (ref 0.00–0.50)

## 2024-03-24 LAB — HIV ANTIBODY (ROUTINE TESTING W REFLEX): HIV Screen 4th Generation wRfx: NONREACTIVE

## 2024-03-24 MED ORDER — ONDANSETRON HCL 4 MG PO TABS: 4.0000 mg | ORAL_TABLET | Freq: Four times a day (QID) | ORAL | Status: DC | PRN

## 2024-03-24 MED ORDER — ALBUTEROL SULFATE (2.5 MG/3ML) 0.083% IN NEBU: 2.5000 mg | INHALATION_SOLUTION | Freq: Four times a day (QID) | RESPIRATORY_TRACT | Status: DC

## 2024-03-24 MED ORDER — TRAZODONE HCL 50 MG PO TABS
25.0000 mg | ORAL_TABLET | Freq: Every evening | ORAL | Status: DC | PRN
  Administered 2024-03-27 – 2024-03-28 (×2): 25 mg via ORAL
  Filled 2024-03-24 (×2): qty 1

## 2024-03-24 MED ORDER — ALBUTEROL SULFATE (2.5 MG/3ML) 0.083% IN NEBU: 2.5000 mg | INHALATION_SOLUTION | Freq: Four times a day (QID) | RESPIRATORY_TRACT | Status: DC | PRN

## 2024-03-24 MED ORDER — LORAZEPAM 2 MG/ML IJ SOLN
1.0000 mg | Freq: Once | INTRAMUSCULAR | Status: AC
  Administered 2024-03-24: 1 mg via INTRAVENOUS
  Filled 2024-03-24: qty 1

## 2024-03-24 MED ORDER — ONDANSETRON HCL 4 MG/2ML IJ SOLN
4.0000 mg | Freq: Four times a day (QID) | INTRAMUSCULAR | Status: DC | PRN
  Administered 2024-03-24: 4 mg via INTRAVENOUS
  Filled 2024-03-24: qty 2

## 2024-03-24 MED ORDER — SODIUM CHLORIDE 0.9 % IV SOLN: INTRAVENOUS | Status: AC

## 2024-03-24 MED ORDER — LORAZEPAM 2 MG/ML IJ SOLN
1.0000 mg | INTRAMUSCULAR | Status: DC | PRN
  Administered 2024-03-25: 1 mg via INTRAVENOUS
  Filled 2024-03-24: qty 1

## 2024-03-24 MED ORDER — PANTOPRAZOLE SODIUM 40 MG IV SOLR
40.0000 mg | INTRAVENOUS | Status: DC
  Administered 2024-03-24 – 2024-03-25 (×2): 40 mg via INTRAVENOUS
  Filled 2024-03-24 (×2): qty 10

## 2024-03-24 MED ORDER — ACETAMINOPHEN 325 MG PO TABS
650.0000 mg | ORAL_TABLET | Freq: Once | ORAL | Status: AC
  Administered 2024-03-24: 650 mg via ORAL
  Filled 2024-03-24: qty 2

## 2024-03-24 MED ORDER — PANTOPRAZOLE SODIUM 40 MG IV SOLR
40.0000 mg | Freq: Once | INTRAVENOUS | Status: AC
  Administered 2024-03-24: 40 mg via INTRAVENOUS
  Filled 2024-03-24: qty 10

## 2024-03-24 MED ORDER — HYDROMORPHONE HCL 1 MG/ML IJ SOLN
1.0000 mg | Freq: Once | INTRAMUSCULAR | Status: AC
  Administered 2024-03-24: 1 mg via INTRAVENOUS
  Filled 2024-03-24: qty 1

## 2024-03-24 MED ORDER — ONDANSETRON HCL 4 MG/2ML IJ SOLN
4.0000 mg | Freq: Once | INTRAMUSCULAR | Status: AC
  Administered 2024-03-24: 4 mg via INTRAVENOUS
  Filled 2024-03-24: qty 2

## 2024-03-24 MED ORDER — SUCRALFATE 1 G PO TABS
1.0000 g | ORAL_TABLET | Freq: Three times a day (TID) | ORAL | Status: DC
  Administered 2024-03-24 – 2024-03-25 (×4): 1 g via ORAL
  Filled 2024-03-24 (×4): qty 1

## 2024-03-24 MED ORDER — IOHEXOL 350 MG/ML SOLN
100.0000 mL | Freq: Once | INTRAVENOUS | Status: AC | PRN
  Administered 2024-03-24: 100 mL via INTRAVENOUS

## 2024-03-24 MED ORDER — ACETAMINOPHEN 650 MG RE SUPP
650.0000 mg | Freq: Four times a day (QID) | RECTAL | Status: DC | PRN
Start: 2024-03-24 — End: 2024-03-29

## 2024-03-24 MED ORDER — HYDROMORPHONE HCL 1 MG/ML IJ SOLN
0.5000 mg | INTRAMUSCULAR | Status: DC | PRN
  Administered 2024-03-24 – 2024-03-26 (×10): 0.5 mg via INTRAVENOUS
  Filled 2024-03-24 (×10): qty 0.5

## 2024-03-24 MED ORDER — FAMOTIDINE IN NACL 20-0.9 MG/50ML-% IV SOLN
20.0000 mg | Freq: Once | INTRAVENOUS | Status: AC
  Administered 2024-03-24: 20 mg via INTRAVENOUS
  Filled 2024-03-24: qty 50

## 2024-03-24 MED ORDER — GABAPENTIN 100 MG PO CAPS
100.0000 mg | ORAL_CAPSULE | Freq: Three times a day (TID) | ORAL | Status: DC
  Administered 2024-03-25 – 2024-03-29 (×10): 100 mg via ORAL
  Filled 2024-03-24 (×11): qty 1

## 2024-03-24 MED ORDER — NYSTATIN 100000 UNIT/GM EX CREA
TOPICAL_CREAM | Freq: Two times a day (BID) | CUTANEOUS | Status: DC
  Administered 2024-03-25 – 2024-03-27 (×3): 1 via TOPICAL
  Filled 2024-03-24: qty 30

## 2024-03-24 MED ORDER — ACETAMINOPHEN 325 MG PO TABS
650.0000 mg | ORAL_TABLET | Freq: Four times a day (QID) | ORAL | Status: DC | PRN
Start: 2024-03-24 — End: 2024-03-29

## 2024-03-24 NOTE — ED Notes (Signed)
Pt ambulated to bathroom with one assist.

## 2024-03-24 NOTE — ED Notes (Signed)
 Pt asked this medic to speak with husband on speaker phone, he noted concern that we were medicating his wife with out food.  He was informed of her dietary restrictions.  Pt returned to resting, lights off and call bell with in reach.

## 2024-03-24 NOTE — H&P (Addendum)
 History and Physical  Maria Wright UJW:[id number] DOB: 08-27-1971 DOA: 03/24/2024  PCP: Gail Joseph, FNP   Chief Complaint: Abdominal pain  HPI: Maria Wright is a 53 y.o. female with medical history significant for hypertension, hypothyroidism being admitted to the hospital with intractable abdominal pain.  She has been having intermittent abdominal pain for the last few months, she has been seen for this at urgent care as well as in the ER a couple days ago.  Workup including CT scans of the abdomen as well as CT angiogram of the chest unremarkable for any acute process.  She was found to have cholelithiasis, was seen at [facility] ER 2 days ago and evaluated by surgical team at that time.  They did not feel any surgical intervention was warranted, they recommended gastroenterology evaluation if the patient's symptoms not improved.  Patient unable to tell me clearly whether her pain got better the day in the ER, but regardless she was discharged home.  States that after going home her abdominal pain continued and so she came back to the ER today.  She denies any fevers, chills, has some mild nausea without vomiting.  No chest pain, pleurisy, has been slightly constipated, stool was brown she might have seen some spots in bright red blood in her stool lately.  She also has longstanding bilateral arm numbness and tingling, this has been going on even longer than her abdominal pain.  More recently, she started to develop numbness and tingling in her bilateral lower extremities.  She feels globally weak, but denies any focal weakness.  Review of Systems: Please see HPI for pertinent positives and negatives. A complete 10 system review of systems are otherwise negative.  Past Medical History:  Diagnosis Date   Anemia    Irregular periods    History reviewed. No pertinent surgical history. Social History:  reports that she has been smoking. She has never used smokeless tobacco. She reports  that she does not currently use alcohol. She reports current drug use. Drug: Marijuana.  Allergies  Allergen Reactions   Penicillins Hives    Has patient had a PCN reaction causing immediate rash, facial/tongue/throat swelling, SOB or lightheadedness with hypotension: Yes Has patient had a PCN reaction causing severe rash involving mucus membranes or skin necrosis: Yes Has patient had a PCN reaction that required hospitalization: No Has patient had a PCN reaction occurring within the last 10 years: No If all of the above answers are "NO", then may proceed with Cephalosporin use.     Family History  Problem Relation Age of Onset   Iron deficiency Mother      Prior to Admission medications   Medication Sig Start Date End Date Taking? Authorizing Provider  ferrous sulfate  325 (65 FE) MG tablet Take 1 tablet (325 mg total) by mouth 2 (two) times daily with a meal. 05/01/18   Daren Eck, DO  nitrofurantoin , macrocrystal-monohydrate, (MACROBID ) 100 MG capsule Take 1 capsule (100 mg total) by mouth 2 (two) times daily. 09/01/18   Tegeler, Marine Sia, MD  prochlorperazine  (COMPAZINE ) 10 MG tablet Take 1 tablet (10 mg total) by mouth 2 (two) times daily as needed for nausea or vomiting. 09/01/18   Tegeler, Marine Sia, MD  sucralfate  (CARAFATE ) 1 g tablet Take 1 tablet (1 g total) by mouth 4 (four) times daily -  with meals and at bedtime. 03/22/24   Auston Blush, MD    Physical Exam: BP (!) 155/77 (BP Location: Right Arm)   Pulse  73   Temp 98.4 F (36.9 C) (Oral)   Resp (!) 24   Ht 5\' 4"  (1.626 m)   Wt 72.6 kg   LMP  (LMP Unknown)   SpO2 100%   BMI 27.46 kg/m  General:  Alert, oriented, calm, in no acute distress  Eyes: EOMI, clear conjuctivae, white sclerea Neck: supple, no masses, trachea mildline  Cardiovascular: RRR, no murmurs or rubs, no peripheral edema  Respiratory: clear to auscultation bilaterally, no wheezes, no crackles  Abdomen: soft, minimally tender,  nondistended, normal bowel tones heard  Skin: dry, no rashes  Musculoskeletal: no joint effusions, normal range of motion  Psychiatric: appropriate affect, normal speech  Neurologic: extraocular muscles intact, clear speech, moving all extremities with intact sensorium         Labs on Admission:  Basic Metabolic Panel: Recent Labs  Lab 03/22/24 0850 03/24/24 0109  NA 141 142  K 3.7 3.6  CL 111 107  CO2 19* 17*  GLUCOSE 97 108*  BUN 15 11  CREATININE 1.13* 1.11*  CALCIUM 9.5 10.0   Liver Function Tests: Recent Labs  Lab 03/22/24 0850 03/24/24 0109  AST 20 24  ALT 13 15  ALKPHOS 61 82  BILITOT 1.0 1.2  PROT 7.0 7.6  ALBUMIN 3.9 4.7   Recent Labs  Lab 03/22/24 0850 03/24/24 0109  LIPASE 26 15   No results for input(s): "AMMONIA" in the last 168 hours. CBC: Recent Labs  Lab 03/22/24 0850 03/24/24 0109  WBC 7.7 8.6  NEUTROABS  --  5.2  HGB 10.0* 9.8*  HCT 29.2* 27.7*  MCV 111.5* 107.8*  PLT 196 204   Cardiac Enzymes: No results for input(s): "CKTOTAL", "CKMB", "CKMBINDEX", "TROPONINI" in the last 168 hours. BNP (last 3 results) No results for input(s): "BNP" in the last 8760 hours.  ProBNP (last 3 results) No results for input(s): "PROBNP" in the last 8760 hours.  CBG: No results for input(s): "GLUCAP" in the last 168 hours.  Radiological Exams on Admission: CT Angio Chest/Abd/Pel for Dissection W and/or Wo Contrast Result Date: 03/24/2024 CLINICAL DATA:  Chest and back pain, initial encounter EXAM: CT ANGIOGRAPHY CHEST, ABDOMEN AND PELVIS TECHNIQUE: Non-contrast CT of the chest was initially obtained. Multidetector CT imaging through the chest, abdomen and pelvis was performed using the standard protocol during bolus administration of intravenous contrast. Multiplanar reconstructed images and MIPs were obtained and reviewed to evaluate the vascular anatomy. RADIATION DOSE REDUCTION: This exam was performed according to the departmental dose-optimization  program which includes automated exposure control, adjustment of the mA and/or kV according to patient size and/or use of iterative reconstruction technique. CONTRAST:  OMNIPAQUE  IOHEXOL  350 MG/ML SOLN COMPARISON:  Chest x-ray from earlier in the same day. FINDINGS: CTA CHEST FINDINGS Cardiovascular: Initial precontrast images show no hyperdense crescent to suggest acute aortic injury. Postcontrast images show no aneurysmal dilatation of the aorta. No dissection is noted. The heart is not significantly enlarged. Pulmonary artery shows a normal branching pattern bilaterally. No intraluminal filling defect to suggest pulmonary embolism is noted. No significant coronary calcifications are noted. Mediastinum/Nodes: Thoracic inlet is within normal limits. No hilar or mediastinal adenopathy is noted. The esophagus as visualized is within normal limits. Lungs/Pleura: Lungs are well aerated bilaterally. No focal infiltrate is noted. Stable atelectasis is seen at the right base. No parenchymal nodules are seen. Musculoskeletal: No acute rib abnormality is noted. Review of the MIP images confirms the above findings. CTA ABDOMEN AND PELVIS FINDINGS VASCULAR Aorta: Abdominal aorta  shows no aneurysmal dilatation or dissection. Mild atherosclerotic calcifications are seen. Celiac: Patent without evidence of aneurysm, dissection, vasculitis or significant stenosis. SMA: Patent without evidence of aneurysm, dissection, vasculitis or significant stenosis. Renals: Both renal arteries are patent without evidence of aneurysm, dissection, vasculitis, fibromuscular dysplasia or significant stenosis. IMA: Patent without evidence of aneurysm, dissection, vasculitis or significant stenosis. Inflow: Iliacs show mild atherosclerotic calcification without focal stenosis or dilatation. Veins: No specific venous abnormality is noted. Review of the MIP images confirms the above findings. NON-VASCULAR Hepatobiliary: Liver is within normal  limits. Vicarious excretion of contrast in the gallbladder. No discrete stones are seen at this time. Pancreas: Unremarkable. No pancreatic ductal dilatation or surrounding inflammatory changes. Spleen: Normal in size without focal abnormality. Adrenals/Urinary Tract: Adrenal glands are within normal limits. Kidneys demonstrate no renal calculi or obstructive changes. The bladder is decompressed. Stomach/Bowel: The appendix is within normal limits. No obstructive or inflammatory changes of the colon are seen. Small bowel and stomach are within normal limits. Lymphatic: No lymphadenopathy is seen. Reproductive: Uterus and bilateral adnexa are unremarkable. Other: No abdominal wall hernia or abnormality. No abdominopelvic ascites. Musculoskeletal: No acute or significant osseous findings. Review of the MIP images confirms the above findings. IMPRESSION: CTA of the chest: No evidence of aneurysmal dilatation or dissection. Minimal right basilar atelectasis. CTA of the abdomen and pelvis: No acute aortic abnormality is noted. No acute abnormality noted. Electronically Signed   By: Violeta Grey M.D.   On: 03/24/2024 02:44   DG Chest Portable 1 View Result Date: 03/24/2024 CLINICAL DATA:  Chest pain EXAM: PORTABLE CHEST 1 VIEW COMPARISON:  03/22/2024 FINDINGS: The heart size and mediastinal contours are within normal limits. Both lungs are clear. The visualized skeletal structures are unremarkable. IMPRESSION: No active disease. Electronically Signed   By: Juanetta Nordmann M.D.   On: 03/24/2024 02:17   Assessment/Plan Maria Wright is a 53 y.o. female with medical history significant for hypertension, hypothyroidism being admitted to the hospital with intractable abdominal pain.   Intractable abdominal pain-with associated nausea.  Patient has never had endoscopy.  Troponin remains negative.  CT imaging unremarkable. -Observation admission -Clear liquid diet -Pain and nausea control -Gentle IV fluids -GI  consult  Hypertension-amlodipine  Hypothyroidism-Synthroid  Neuropathy-would likely benefit from outpatient neurology evaluation, will start low-dose gabapentin  DVT prophylaxis: Lovenox      Code Status: Full Code  Consults called: GI Dr. Kimble Pennant consulted via secure chat  Admission status: Observation  Time spent: 55 minutes  Fannie Alomar Rickey Charm MD [facility] Pager [phone]  If 7PM-7AM, please contact night-coverage [email or web address] Password [facility]  03/24/2024, 3:19 PM

## 2024-03-24 NOTE — ED Notes (Signed)
Pt sleeping/ resp even and regular  

## 2024-03-24 NOTE — ED Notes (Signed)
 Patient's spouse requesting a call whenever patient is about to be transferred.

## 2024-03-24 NOTE — ED Notes (Signed)
 [facility] at bedside

## 2024-03-24 NOTE — ED Notes (Signed)
 Pt transported to [facility] from [facility] by [employer]

## 2024-03-24 NOTE — Progress Notes (Signed)
 Plan of Care Note for accepted transfer   Patient: Maria Wright MRN: [id number]   DOA: 03/24/2024  Facility requesting transfer: [facility] Requesting Provider: Earma Gloss, MD  Reason for transfer: Chest pain, rule out ACS and epigastric pain. Facility course: Maria Wright is a 53 y.o. female, with history of hypertension, GERD, hypothyroidism, low back pain, anemia coming with upper abdominal pain that wraps around to her chest and back.  Pain has been coming and going for the past 2 days associated with nausea.  Symptoms ongoing for several months but worse over the past several weeks.  Was seen at [facility] 2 days ago and found to have gallstones.  She was seen by surgery who felt the gallstones were not her issue but favored more of a gastritis or ulcer type picture.  She has had nausea but no vomiting.  No black or bloody stools.  Pain starts in her epigastrium radiates to her bilateral ribs and chest.  Some associated shortness of breath and tingling to her arms and legs.  Still has appendix and gallbladder.  No history of EGD.  Denies any cardiac history.  When she came to the ER BP was 133/96 with otherwise normal vital signs.  Labs revealed anemia with macrocytosis and CMP was remarkable for CO2 of 17 and anion gap of 18.    The patient was given 40 mg of IV Protonix, 4 mg of IV Zofran , 1 mg of IV Ativan, 1 mg IV Dilaudid and 20 mg of IV Pepcid.  Plan of care: The patient is accepted for admission to Telemetry unit, at [facility]..  The patient will be under the care and responsibility of the EDP until arrival to [facility]  Author: Virgene Griffin, MD 03/24/2024  Check [email or web address] for on-call coverage.  Nursing staff, Please call [facility] Admits [facility] Consults [facility]-Wide number on Amion as soon as patient's arrival, so appropriate admitting provider can evaluate the pt.

## 2024-03-24 NOTE — ED Triage Notes (Signed)
 Pt coming in with complaints of worsening chest pain wrapping around chest and back underneath breasts. Pain comes in spasms per patient. Reports nausea. Denies vomiting, fever, diarrhea. Symptoms have been going on for months, became severe starting Tuesday. Pt reports tingling in bilateral hands/legs with decreased sensation x months (worsening in past week as well). Pt seen yesterday in ER and told she either has gall stones or ulcers. Pt reports symptoms progressing since this morning.

## 2024-03-24 NOTE — ED Provider Notes (Signed)
 [place] [facility] Provider Note   CSN: [id number] Arrival date & time: 03/24/24  0109     History  Chief Complaint  Patient presents with   Chest Pain   Abdominal Pain   Tingling    Maria Wright is a 53 y.o. female.  Patient with upper abdominal pain that wraps around to her chest and back.  Pain has been coming and going for the past 2 days associated with nausea.  Symptoms ongoing for several months but worse over the past several weeks.  Was seen at [facility] 2 days ago and found to have gallstones.  She was seen by surgery who felt the gallstones were not her issue but favored more of a gastritis or ulcer type picture.  She has had nausea but no vomiting.  No black or bloody stools.  Pain starts in her epigastrium radiates to her bilateral ribs and chest.  Some associated shortness of breath and tingling to her arms and legs.  Still has appendix and gallbladder.  No history of EGD.  Denies any cardiac history.  The history is provided by the patient and a relative.  Chest Pain Associated symptoms: abdominal pain, nausea and shortness of breath   Associated symptoms: no dizziness, no fever, no headache, no vomiting and no weakness   Abdominal Pain Associated symptoms: chest pain, nausea and shortness of breath   Associated symptoms: no dysuria, no fever, no hematuria and no vomiting        Home Medications Prior to Admission medications   Medication Sig Start Date End Date Taking? Authorizing Provider  ferrous sulfate  325 (65 FE) MG tablet Take 1 tablet (325 mg total) by mouth 2 (two) times daily with a meal. 05/01/18   Daren Eck, DO  nitrofurantoin , macrocrystal-monohydrate, (MACROBID ) 100 MG capsule Take 1 capsule (100 mg total) by mouth 2 (two) times daily. 09/01/18   Tegeler, Marine Sia, MD  prochlorperazine  (COMPAZINE ) 10 MG tablet Take 1 tablet (10 mg total) by mouth 2 (two) times daily as needed for nausea or vomiting.  09/01/18   Tegeler, Marine Sia, MD  sucralfate  (CARAFATE ) 1 g tablet Take 1 tablet (1 g total) by mouth 4 (four) times daily -  with meals and at bedtime. 03/22/24   Auston Blush, MD      Allergies    Penicillins    Review of Systems   Review of Systems  Constitutional:  Positive for activity change and appetite change. Negative for fever.  HENT:  Negative for congestion and rhinorrhea.   Respiratory:  Positive for chest tightness and shortness of breath.   Cardiovascular:  Positive for chest pain.  Gastrointestinal:  Positive for abdominal pain and nausea. Negative for vomiting.  Genitourinary:  Negative for dysuria and hematuria.  Musculoskeletal:  Negative for arthralgias.  Skin:  Negative for rash.  Neurological:  Negative for dizziness, weakness and headaches.   all other systems are negative except as noted in the HPI and PMH.    Physical Exam Updated Vital Signs BP (!) 133/96 (BP Location: Right Arm)   Pulse 85   Temp 98.6 F (37 C) (Oral)   Resp 20   Ht 5\' 4"  (1.626 m)   Wt 72.6 kg   LMP  (LMP Unknown)   SpO2 100%   BMI 27.46 kg/m  Physical Exam Vitals and nursing note reviewed.  Constitutional:      General: She is not in acute distress.    Appearance: She is  well-developed.     Comments: Anxious, tearful  HENT:     Head: Normocephalic and atraumatic.     Mouth/Throat:     Pharynx: No oropharyngeal exudate.  Eyes:     Conjunctiva/sclera: Conjunctivae normal.     Pupils: Pupils are equal, round, and reactive to light.  Neck:     Comments: No meningismus. Cardiovascular:     Rate and Rhythm: Normal rate and regular rhythm.     Heart sounds: Normal heart sounds. No murmur heard. Pulmonary:     Effort: Pulmonary effort is normal. No respiratory distress.     Breath sounds: Normal breath sounds.  Abdominal:     Palpations: Abdomen is soft.     Tenderness: There is abdominal tenderness. There is no guarding or rebound.     Comments: Epigastric and right  upper quadrant tenderness with voluntary guarding.  No rebound.  Musculoskeletal:        General: No tenderness. Normal range of motion.     Cervical back: Normal range of motion and neck supple.  Skin:    General: Skin is warm.  Neurological:     Mental Status: She is alert and oriented to person, place, and time.     Cranial Nerves: No cranial nerve deficit.     Motor: No abnormal muscle tone.     Coordination: Coordination normal.     Comments:  5/5 strength throughout. CN 2-12 intact.Equal grip strength.   Psychiatric:        Behavior: Behavior normal.     ED Results / Procedures / Treatments   Labs (all labs ordered are listed, but only abnormal results are displayed) Labs Reviewed  CBC WITH DIFFERENTIAL/PLATELET - Abnormal; Notable for the following components:      Result Value   RBC 2.57 (*)    Hemoglobin 9.8 (*)    HCT 27.7 (*)    MCV 107.8 (*)    MCH 38.1 (*)    RDW 16.8 (*)    nRBC 0.5 (*)    All other components within normal limits  COMPREHENSIVE METABOLIC PANEL WITH GFR - Abnormal; Notable for the following components:   CO2 17 (*)    Glucose, Bld 108 (*)    Creatinine, Ser 1.11 (*)    GFR, Estimated 59 (*)    Anion gap 18 (*)    All other components within normal limits  LIPASE, BLOOD  D-DIMER, QUANTITATIVE  URINALYSIS, ROUTINE W REFLEX MICROSCOPIC  TROPONIN T, HIGH SENSITIVITY  TROPONIN T, HIGH SENSITIVITY    EKG EKG Interpretation Date/Time:  Saturday March 24 2024 01:20:22 EDT Ventricular Rate:  75 PR Interval:  159 QRS Duration:  82 QT Interval:  436 QTC Calculation: 487 R Axis:   71  Text Interpretation: Sinus rhythm Probable left atrial enlargement Borderline prolonged QT interval No significant change was found Confirmed by Earma Gloss [phone]) on 03/24/2024 1:23:42 AM  Radiology CT Angio Chest/Abd/Pel for Dissection W and/or Wo Contrast Result Date: 03/24/2024 CLINICAL DATA:  Chest and back pain, initial encounter EXAM: CT ANGIOGRAPHY  CHEST, ABDOMEN AND PELVIS TECHNIQUE: Non-contrast CT of the chest was initially obtained. Multidetector CT imaging through the chest, abdomen and pelvis was performed using the standard protocol during bolus administration of intravenous contrast. Multiplanar reconstructed images and MIPs were obtained and reviewed to evaluate the vascular anatomy. RADIATION DOSE REDUCTION: This exam was performed according to the departmental dose-optimization program which includes automated exposure control, adjustment of the mA and/or kV according to patient size  and/or use of iterative reconstruction technique. CONTRAST:  OMNIPAQUE  IOHEXOL  350 MG/ML SOLN COMPARISON:  Chest x-ray from earlier in the same day. FINDINGS: CTA CHEST FINDINGS Cardiovascular: Initial precontrast images show no hyperdense crescent to suggest acute aortic injury. Postcontrast images show no aneurysmal dilatation of the aorta. No dissection is noted. The heart is not significantly enlarged. Pulmonary artery shows a normal branching pattern bilaterally. No intraluminal filling defect to suggest pulmonary embolism is noted. No significant coronary calcifications are noted. Mediastinum/Nodes: Thoracic inlet is within normal limits. No hilar or mediastinal adenopathy is noted. The esophagus as visualized is within normal limits. Lungs/Pleura: Lungs are well aerated bilaterally. No focal infiltrate is noted. Stable atelectasis is seen at the right base. No parenchymal nodules are seen. Musculoskeletal: No acute rib abnormality is noted. Review of the MIP images confirms the above findings. CTA ABDOMEN AND PELVIS FINDINGS VASCULAR Aorta: Abdominal aorta shows no aneurysmal dilatation or dissection. Mild atherosclerotic calcifications are seen. Celiac: Patent without evidence of aneurysm, dissection, vasculitis or significant stenosis. SMA: Patent without evidence of aneurysm, dissection, vasculitis or significant stenosis. Renals: Both renal arteries are  patent without evidence of aneurysm, dissection, vasculitis, fibromuscular dysplasia or significant stenosis. IMA: Patent without evidence of aneurysm, dissection, vasculitis or significant stenosis. Inflow: Iliacs show mild atherosclerotic calcification without focal stenosis or dilatation. Veins: No specific venous abnormality is noted. Review of the MIP images confirms the above findings. NON-VASCULAR Hepatobiliary: Liver is within normal limits. Vicarious excretion of contrast in the gallbladder. No discrete stones are seen at this time. Pancreas: Unremarkable. No pancreatic ductal dilatation or surrounding inflammatory changes. Spleen: Normal in size without focal abnormality. Adrenals/Urinary Tract: Adrenal glands are within normal limits. Kidneys demonstrate no renal calculi or obstructive changes. The bladder is decompressed. Stomach/Bowel: The appendix is within normal limits. No obstructive or inflammatory changes of the colon are seen. Small bowel and stomach are within normal limits. Lymphatic: No lymphadenopathy is seen. Reproductive: Uterus and bilateral adnexa are unremarkable. Other: No abdominal wall hernia or abnormality. No abdominopelvic ascites. Musculoskeletal: No acute or significant osseous findings. Review of the MIP images confirms the above findings. IMPRESSION: CTA of the chest: No evidence of aneurysmal dilatation or dissection. Minimal right basilar atelectasis. CTA of the abdomen and pelvis: No acute aortic abnormality is noted. No acute abnormality noted. Electronically Signed   By: Violeta Grey M.D.   On: 03/24/2024 02:44   DG Chest Portable 1 View Result Date: 03/24/2024 CLINICAL DATA:  Chest pain EXAM: PORTABLE CHEST 1 VIEW COMPARISON:  03/22/2024 FINDINGS: The heart size and mediastinal contours are within normal limits. Both lungs are clear. The visualized skeletal structures are unremarkable. IMPRESSION: No active disease. Electronically Signed   By: Juanetta Nordmann M.D.   On:  03/24/2024 02:17   US  Abdomen Limited Result Date: 03/22/2024 CLINICAL DATA:  upper abdominal pain EXAM: ULTRASOUND ABDOMEN LIMITED RIGHT UPPER QUADRANT COMPARISON:  March 22, 2024 FINDINGS: Gallbladder: A single mobile gallstone present. No wall thickening or pericholecystic fluid. Common bile duct: Diameter: 4 mm Liver: Normal echogenicity. No focal lesion identified. No intrahepatic biliary ductal dilation. Portal vein is patent on color Doppler imaging with normal direction of blood flow towards the liver. Other: The visualized portions of the pancreas are within normal limits. IMPRESSION: Cholecystolithiasis. No sonographic changes of acute cholecystitis. Electronically Signed   By: Rance Burrows M.D.   On: 03/22/2024 15:04   CT ABDOMEN PELVIS W WO CONTRAST Result Date: 03/22/2024 CLINICAL DATA:  Abdominal pain, acute, nonlocalized  Abdominal/flank pain, stone suspected Abdominal pain with pain under breast and nausea. EXAM: CT ABDOMEN AND PELVIS WITHOUT AND WITH CONTRAST TECHNIQUE: Multidetector CT imaging of the abdomen and pelvis was performed following the standard protocol before and following the bolus administration of intravenous contrast. RADIATION DOSE REDUCTION: This exam was performed according to the departmental dose-optimization program which includes automated exposure control, adjustment of the mA and/or kV according to patient size and/or use of iterative reconstruction technique. CONTRAST:  75mL OMNIPAQUE  IOHEXOL  350 MG/ML SOLN COMPARISON:  Abdominopelvic CT 03/20/2015. FINDINGS: Lower chest: Probable mild scarring dependently at the right lung base. The visualized lung bases are otherwise clear. No pleural or pericardial effusion. Hepatobiliary: The liver is normal in density without suspicious focal abnormality. Small gallstones are noted. No evidence of gallbladder wall thickening, surrounding inflammation or biliary ductal dilatation. Pancreas: Unremarkable. No pancreatic ductal  dilatation or surrounding inflammatory changes. Spleen: Normal in size without focal abnormality. Adrenals/Urinary Tract: Both adrenal glands appear normal. Pre contrast images demonstrate no renal, ureteral or bladder calculi. Following contrast, both kidneys enhance normally. No evidence of enhancing renal mass. There is a small cyst in the upper pole of the right kidney for which no specific follow-up imaging is recommended. The bladder appears unremarkable for its degree of distention. Stomach/Bowel: No enteric contrast administered. The stomach appears unremarkable for its degree of distension. No evidence of bowel wall thickening, distention or surrounding inflammatory change. The appendix appears normal. Vascular/Lymphatic: There are no enlarged abdominal or pelvic lymph nodes. Aortic and branch vessel atherosclerosis without evidence of aneurysm or large vessel occlusion. Reproductive: The uterus and ovaries appear unremarkable. No adnexal mass. Other: No evidence of abdominal wall mass or hernia. No ascites or pneumoperitoneum. Musculoskeletal: No acute or significant osseous findings. IMPRESSION: 1. No acute findings or explanation for the patient's symptoms. 2. Cholelithiasis without evidence of cholecystitis or biliary ductal dilatation. 3. No evidence of urinary tract calculus or hydronephrosis. 4.  Aortic Atherosclerosis (ICD10-I70.0). Electronically Signed   By: Elmon Hagedorn M.D.   On: 03/22/2024 11:06   DG Chest Port 1 View Result Date: 03/22/2024 CLINICAL DATA:  Epigastric abdominal pain. EXAM: PORTABLE CHEST 1 VIEW COMPARISON:  September 01, 2018. FINDINGS: The heart size and mediastinal contours are within normal limits. Both lungs are clear. The visualized skeletal structures are unremarkable. IMPRESSION: No active disease. Electronically Signed   By: Rosalene Colon M.D.   On: 03/22/2024 08:50    Procedures Procedures    Medications Ordered in ED Medications  HYDROmorphone  (DILAUDID) injection 1 mg (has no administration in time range)  ondansetron  (ZOFRAN ) injection 4 mg (has no administration in time range)  LORazepam (ATIVAN) injection 1 mg (has no administration in time range)    ED Course/ Medical Decision Making/ A&P                                 Medical Decision Making Amount and/or Complexity of Data Reviewed Labs: ordered. Decision-making details documented in ED Course. Radiology: ordered and independent interpretation performed. Decision-making details documented in ED Course. ECG/medicine tests: ordered and independent interpretation performed. Decision-making details documented in ED Course.  Risk Prescription drug management. Decision regarding hospitalization.   Upper abdominal pain and nausea.  Recent evaluation for same found to have gallstones.  EKG without acute ischemia.  Patient given pain and nausea medications.  Given PPI and Pepcid.  Labs are reassuring.  Has not had leukocytosis.  LFTs and lipase are normal. EKG no acute ischemia.  Troponin negative. D-dimer negative with low suspicion for PE or aortic dissection.  Pain improved with pain and nausea medication.  Given PPI as well as Pepcid.  CT scan without acute pathology.  No evidence of aortic dissection.  Suspect likely gastritis or esophagitis or possibly peptic ulcer disease.  Does have gallstones but is recent seen by surgery who did not feel this was causing her symptoms.  Given recurrent symptoms we will plan observation admission for symptom control, fluids and PPI.  May benefit from GI evaluation in the morning.  Continue IV fluids and PPI.  Message sent to gastroenterology.  Pain has improved after reassessment.  Troponin negative with low suspicion for ACS.  D-dimer negative with low suspicion for PE.  LFTs and lipase are normal.  Gallstones without evidence of cholecystitis.  Previous seen by surgery who did not feel gallstones are causing her  symptoms.  Admission for pain control and GI evaluation in the morning discussed with Dr. Achilles Holes.        Final Clinical Impression(s) / ED Diagnoses Final diagnoses:  Epigastric pain    Rx / DC Orders ED Discharge Orders     None         Rane Blitch, Mara [place], MD 03/24/24 [phone]

## 2024-03-24 NOTE — Progress Notes (Signed)
 Pt with 1x episode liquid and yellow of emesis.

## 2024-03-24 NOTE — ED Notes (Signed)
 [facility] notes a 25 min ETA

## 2024-03-24 NOTE — ED Notes (Signed)
 Called [facility] for transport.

## 2024-03-24 NOTE — Progress Notes (Signed)
 Pt verbalized concerns of numbness, tingling and weakness in both hands and now left leg. She states her hands have been this way over the last 3 months and her leg started over the last week. She states she frequently drops things and now feels like she is going to fall when standing due to her left leg now being affected. It affects her daily living and her ability to function normally. She also inquired about why she has dark circles under her eyes--this is a new development over the last months as well.   She kindly declined the gabapentin due to fear of side effects. An education print out of gabapentin was provided. She is appreciative that gabapentin is offered for symptom relief but is hoping to find the cause of her symptoms rather than covering the symptom.   MD aware.

## 2024-03-25 DIAGNOSIS — G629 Polyneuropathy, unspecified: Secondary | ICD-10-CM | POA: Diagnosis not present

## 2024-03-25 DIAGNOSIS — R1013 Epigastric pain: Secondary | ICD-10-CM | POA: Diagnosis not present

## 2024-03-25 LAB — BASIC METABOLIC PANEL WITH GFR
Anion gap: 10 (ref 5–15)
BUN: 10 mg/dL (ref 6–20)
CO2: 19 mmol/L — ABNORMAL LOW (ref 22–32)
Calcium: 8.6 mg/dL — ABNORMAL LOW (ref 8.9–10.3)
Chloride: 111 mmol/L (ref 98–111)
Creatinine, Ser: 0.85 mg/dL (ref 0.44–1.00)
GFR, Estimated: >60 mL/min (ref >60)
Glucose, Bld: 77 mg/dL (ref 70–99)
Potassium: 3.3 mmol/L — ABNORMAL LOW (ref 3.5–5.1)
Sodium: 140 mmol/L (ref 135–145)

## 2024-03-25 LAB — TSH: TSH: 1.682 u[IU]/mL (ref 0.350–4.500)

## 2024-03-25 LAB — VITAMIN B12: Vitamin B-12: 74 pg/mL — ABNORMAL LOW (ref 180–914)

## 2024-03-25 LAB — FOLATE: Folate: 30.6 ng/mL (ref >5.9)

## 2024-03-25 MED ORDER — AMLODIPINE BESYLATE 5 MG PO TABS
5.0000 mg | ORAL_TABLET | Freq: Every day | ORAL | Status: DC
  Administered 2024-03-25 – 2024-03-29 (×5): 5 mg via ORAL
  Filled 2024-03-25 (×5): qty 1

## 2024-03-25 MED ORDER — CYANOCOBALAMIN 1000 MCG/ML IJ SOLN
1000.0000 ug | Freq: Every day | INTRAMUSCULAR | Status: DC
  Administered 2024-03-25 – 2024-03-29 (×5): 1000 ug via INTRAMUSCULAR
  Filled 2024-03-25 (×5): qty 1

## 2024-03-25 NOTE — Hospital Course (Signed)
 Maria Wright is a 52 y.o. female with a history of hypertension and hypothyroidism.  Patient presented secondary to intractable abdominal pain with nausea and vomiting. GI consulted.

## 2024-03-25 NOTE — Consult Note (Signed)
 [facility] Consultation Note  Referring Provider: [facility] Primary Care Physician:  Gail Joseph, FNP Primary Gastroenterologist:  Para Bold  Reason for Consultation:  abdominal pain  HPI: Maria Wright is a 53 y.o. female whom we've been asked to see for abdominal pain.  Patient has had constant upper abdominal pain for about two months; pain worse with movement and eating (especially spicy foods).  No GERD, dysphagia, change in bowel habits, blood in stool.  Weight loss?  Work-up labs and CT unrevealing except for anemia (intermittent, chronic, now Hgb 9.8, with macrocytosis, previous work-up with normal B12, iron, ) and gallstones.   Past Medical History:  Diagnosis Date   Anemia    Irregular periods     History reviewed. No pertinent surgical history.  Prior to Admission medications   Medication Sig Start Date End Date Taking? Authorizing Provider  acetaminophen  (TYLENOL ) 650 MG CR tablet Take 650-1,300 mg by mouth as needed for pain.   Yes [provider]  amLODipine (NORVASC) 5 MG tablet Take 5 mg by mouth in the morning.   Yes [provider]  levothyroxine (SYNTHROID) 125 MCG tablet Take 125 mcg by mouth See admin instructions. Take 0.5 tablet by mouth every day except on Sundays, then take 1 tablet by mouth every week on Sunday   Yes [provider]  omeprazole (PRILOSEC) 40 MG capsule Take 40 mg by mouth in the morning. 03/06/24 03/06/25 Yes [provider]  prochlorperazine  (COMPAZINE ) 10 MG tablet Take 1 tablet (10 mg total) by mouth 2 (two) times daily as needed for nausea or vomiting. 09/01/18  Yes Tegeler, Marine Sia, MD  sucralfate  (CARAFATE ) 1 g tablet Take 1 tablet (1 g total) by mouth 4 (four) times daily -  with meals and at bedtime. 03/22/24  Yes Auston Blush, MD    Current Facility-Administered Medications  Medication Dose Route Frequency Provider Last Rate Last Admin   0.9 %  sodium chloride   infusion   Intravenous Continuous Jannette Mend, Mir M, MD 100 mL/hr at 03/25/24 0200 Infusion Verify at 03/25/24 0200   acetaminophen  (TYLENOL ) tablet 650 mg  650 mg Oral Q6H PRN Jannette Mend, Mir M, MD       Or   acetaminophen  (TYLENOL ) suppository 650 mg  650 mg Rectal Q6H PRN Jannette Mend, Mir M, MD       albuterol (PROVENTIL) (2.5 MG/3ML) 0.083% nebulizer solution 2.5 mg  2.5 mg Nebulization Q6H PRN Jannette Mend, Mir M, MD       gabapentin (NEURONTIN) capsule 100 mg  100 mg Oral TID Ikramullah, Mir M, MD       HYDROmorphone (DILAUDID) injection 0.5 mg  0.5 mg Intravenous Q3H PRN Jannette Mend, Mir M, MD   0.5 mg at 03/25/24 1059   LORazepam (ATIVAN) injection 1 mg  1 mg Intravenous Q4H PRN Jannette Mend, Mir M, MD       nystatin cream (MYCOSTATIN)   Topical BID Jannette Mend, Mir M, MD   Given at 03/25/24 1108   ondansetron  (ZOFRAN ) tablet 4 mg  4 mg Oral Q6H PRN Jannette Mend, Mir M, MD       Or   ondansetron  (ZOFRAN ) injection 4 mg  4 mg Intravenous Q6H PRN Jannette Mend, Mir M, MD   4 mg at 03/24/24 1725   pantoprazole (PROTONIX) injection 40 mg  40 mg Intravenous Q24H Jannette Mend, Mir M, MD   40 mg at 03/24/24 2118   sucralfate  (CARAFATE ) tablet 1 g  1 g Oral TID WC & HS Gaylin Ke, MD  1 g at 03/24/24 2118   traZODone (DESYREL) tablet 25 mg  25 mg Oral QHS PRN Gaylin Ke, MD        Allergies as of 03/24/2024 - Review Complete 03/24/2024  Allergen Reaction Noted   Penicillins Hives 04/29/2018    Family History  Problem Relation Age of Onset   Iron deficiency Mother     Social History   Socioeconomic History   Marital status: Married    Spouse name: Not on file   Number of children: Not on file   Years of education: Not on file   Highest education level: Not on file  Occupational History   Not on file  Tobacco Use   Smoking status: Every Day   Smokeless tobacco: Never  Vaping Use   Vaping status: Never Used  Substance and Sexual Activity   Alcohol use: Not Currently   Drug  use: Yes    Types: Marijuana   Sexual activity: Not on file  Other Topics Concern   Not on file  Social History Narrative   Not on file   Social Drivers of Health   Financial Resource Strain: Not on file  Food Insecurity: Patient Declined (03/24/2024)   Hunger Vital Sign    Worried About Running Out of Food in the Last Year: Patient declined    Ran Out of Food in the Last Year: Patient declined  Transportation Needs: Patient Declined (03/24/2024)   PRAPARE - Administrator, Civil Service (Medical): Patient declined    Lack of Transportation (Non-Medical): Patient declined  Physical Activity: Not on file  Stress: Not on file  Social Connections: Patient Declined (03/24/2024)   Social Connection and Isolation Panel [NHANES]    Frequency of Communication with Friends and Family: Patient declined    Frequency of Social Gatherings with Friends and Family: Patient declined    Attends Religious Services: Patient declined    Active Member of Clubs or Organizations: Patient declined    Attends Banker Meetings: Patient declined    Marital Status: Patient declined  Intimate Partner Violence: Patient Declined (03/24/2024)   Humiliation, Afraid, Rape, and Kick questionnaire    Fear of Current or Ex-Partner: Patient declined    Emotionally Abused: Patient declined    Physically Abused: Patient declined    Sexually Abused: Patient declined    Review of Systems: As per HPI, all others negative  Physical Exam: Vital signs in last 24 hours: Temp:  [98.1 F (36.7 C)-99.6 F (37.6 C)] 98.6 F (37 C) (06/08 0436) Pulse Rate:  [57-78] 57 (06/08 0436) Resp:  [18-24] 18 (06/08 0436) BP: (144-156)/(73-90) 147/79 (06/08 0436) SpO2:  [100 %] 100 % (06/08 0436) Last BM Date : 03/23/24 General:   Alert,  Well-developed, well-nourished, pleasant and cooperative in NAD Head:  Normocephalic and atraumatic. Eyes:  Sclera clear, no icterus.   Conjunctiva pink. Ears:  Normal  auditory acuity. Nose:  No deformity, discharge,  or lesions. Mouth:  No deformity or lesions.  Oropharynx pink & moist. Neck:  Supple; no masses or thyromegaly. Lungs:  No visible respiratory distress Abdomen:  Soft, nondistended, diffuse upper abdominal tenderness without peritonitis, No masses, hepatosplenomegaly or hernias noted. No peritonitis  Msk:  Symmetrical without gross deformities. Normal posture. Pulses:  Normal pulses noted. Extremities:  Without clubbing or edema. Neurologic:  Alert and  oriented x4;  grossly normal neurologically. Skin:  Intact without significant lesions or rashes. Psych:  Alert and cooperative. Normal mood and affect.  Lab Results: Recent Labs    03/24/24 0109  WBC 8.6  HGB 9.8*  HCT 27.7*  PLT 204   BMET Recent Labs    03/24/24 0109 03/25/24 1030  NA 142 140  K 3.6 3.3*  CL 107 111  CO2 17* 19*  GLUCOSE 108* 77  BUN 11 10  CREATININE 1.11* 0.85  CALCIUM 10.0 8.6*   LFT Recent Labs    03/24/24 0109  PROT 7.6  ALBUMIN 4.7  AST 24  ALT 15  ALKPHOS 82  BILITOT 1.2   PT/INR No results for input(s): "LABPROT", "INR" in the last 72 hours.  Studies/Results: CT Angio Chest/Abd/Pel for Dissection W and/or Wo Contrast Result Date: 03/24/2024 CLINICAL DATA:  Chest and back pain, initial encounter EXAM: CT ANGIOGRAPHY CHEST, ABDOMEN AND PELVIS TECHNIQUE: Non-contrast CT of the chest was initially obtained. Multidetector CT imaging through the chest, abdomen and pelvis was performed using the standard protocol during bolus administration of intravenous contrast. Multiplanar reconstructed images and MIPs were obtained and reviewed to evaluate the vascular anatomy. RADIATION DOSE REDUCTION: This exam was performed according to the departmental dose-optimization program which includes automated exposure control, adjustment of the mA and/or kV according to patient size and/or use of iterative reconstruction technique. CONTRAST:  OMNIPAQUE   IOHEXOL  350 MG/ML SOLN COMPARISON:  Chest x-ray from earlier in the same day. FINDINGS: CTA CHEST FINDINGS Cardiovascular: Initial precontrast images show no hyperdense crescent to suggest acute aortic injury. Postcontrast images show no aneurysmal dilatation of the aorta. No dissection is noted. The heart is not significantly enlarged. Pulmonary artery shows a normal branching pattern bilaterally. No intraluminal filling defect to suggest pulmonary embolism is noted. No significant coronary calcifications are noted. Mediastinum/Nodes: Thoracic inlet is within normal limits. No hilar or mediastinal adenopathy is noted. The esophagus as visualized is within normal limits. Lungs/Pleura: Lungs are well aerated bilaterally. No focal infiltrate is noted. Stable atelectasis is seen at the right base. No parenchymal nodules are seen. Musculoskeletal: No acute rib abnormality is noted. Review of the MIP images confirms the above findings. CTA ABDOMEN AND PELVIS FINDINGS VASCULAR Aorta: Abdominal aorta shows no aneurysmal dilatation or dissection. Mild atherosclerotic calcifications are seen. Celiac: Patent without evidence of aneurysm, dissection, vasculitis or significant stenosis. SMA: Patent without evidence of aneurysm, dissection, vasculitis or significant stenosis. Renals: Both renal arteries are patent without evidence of aneurysm, dissection, vasculitis, fibromuscular dysplasia or significant stenosis. IMA: Patent without evidence of aneurysm, dissection, vasculitis or significant stenosis. Inflow: Iliacs show mild atherosclerotic calcification without focal stenosis or dilatation. Veins: No specific venous abnormality is noted. Review of the MIP images confirms the above findings. NON-VASCULAR Hepatobiliary: Liver is within normal limits. Vicarious excretion of contrast in the gallbladder. No discrete stones are seen at this time. Pancreas: Unremarkable. No pancreatic ductal dilatation or surrounding inflammatory  changes. Spleen: Normal in size without focal abnormality. Adrenals/Urinary Tract: Adrenal glands are within normal limits. Kidneys demonstrate no renal calculi or obstructive changes. The bladder is decompressed. Stomach/Bowel: The appendix is within normal limits. No obstructive or inflammatory changes of the colon are seen. Small bowel and stomach are within normal limits. Lymphatic: No lymphadenopathy is seen. Reproductive: Uterus and bilateral adnexa are unremarkable. Other: No abdominal wall hernia or abnormality. No abdominopelvic ascites. Musculoskeletal: No acute or significant osseous findings. Review of the MIP images confirms the above findings. IMPRESSION: CTA of the chest: No evidence of aneurysmal dilatation or dissection. Minimal right basilar atelectasis. CTA of the abdomen and pelvis: No acute  aortic abnormality is noted. No acute abnormality noted. Electronically Signed   By: Violeta Grey M.D.   On: 03/24/2024 02:44   DG Chest Portable 1 View Result Date: 03/24/2024 CLINICAL DATA:  Chest pain EXAM: PORTABLE CHEST 1 VIEW COMPARISON:  03/22/2024 FINDINGS: The heart size and mediastinal contours are within normal limits. Both lungs are clear. The visualized skeletal structures are unremarkable. IMPRESSION: No active disease. Electronically Signed   By: Juanetta Nordmann M.D.   On: 03/24/2024 02:17    Impression:   Abdominal pain.  Constant x 2 months.  Worse with movement and eating.  Unclear etiology but pain does not sound typical of gallbladder process. Gallstones.  Not convinced this is source of patient's abdominal pain.  Plan:   PPI and sucralfate . Soft low fat diet, NPO after midnight. Endoscopy tomorrow, pending anesthesia/endoscopy availability. Risks (bleeding, infection, bowel perforation that could require surgery, sedation-related changes in cardiopulmonary systems), benefits (identification and possible treatment of source of symptoms, exclusion of certain causes of  symptoms), and alternatives (watchful waiting, radiographic imaging studies, empiric medical treatment) of upper endoscopy (EGD) were explained to patient/family in detail and patient wishes to proceed.    LOS: 0 days   Estefani Bateson M  03/25/2024, 11:58 AM  Cell [phone] If no answer or after 5 PM call [phone]

## 2024-03-25 NOTE — H&P (View-Only) (Signed)
 [facility] Consultation Note  Referring Provider: [facility] Primary Care Physician:  Gail Joseph, FNP Primary Gastroenterologist:  Para Bold  Reason for Consultation:  abdominal pain  HPI: Maria Wright is a 53 y.o. female whom we've been asked to see for abdominal pain.  Patient has had constant upper abdominal pain for about two months; pain worse with movement and eating (especially spicy foods).  No GERD, dysphagia, change in bowel habits, blood in stool.  Weight loss?  Work-up labs and CT unrevealing except for anemia (intermittent, chronic, now Hgb 9.8, with macrocytosis, previous work-up with normal B12, iron, ) and gallstones.   Past Medical History:  Diagnosis Date   Anemia    Irregular periods     History reviewed. No pertinent surgical history.  Prior to Admission medications   Medication Sig Start Date End Date Taking? Authorizing Provider  acetaminophen  (TYLENOL ) 650 MG CR tablet Take 650-1,300 mg by mouth as needed for pain.   Yes [provider]  amLODipine (NORVASC) 5 MG tablet Take 5 mg by mouth in the morning.   Yes [provider]  levothyroxine (SYNTHROID) 125 MCG tablet Take 125 mcg by mouth See admin instructions. Take 0.5 tablet by mouth every day except on Sundays, then take 1 tablet by mouth every week on Sunday   Yes [provider]  omeprazole (PRILOSEC) 40 MG capsule Take 40 mg by mouth in the morning. 03/06/24 03/06/25 Yes [provider]  prochlorperazine  (COMPAZINE ) 10 MG tablet Take 1 tablet (10 mg total) by mouth 2 (two) times daily as needed for nausea or vomiting. 09/01/18  Yes Tegeler, Marine Sia, MD  sucralfate  (CARAFATE ) 1 g tablet Take 1 tablet (1 g total) by mouth 4 (four) times daily -  with meals and at bedtime. 03/22/24  Yes Auston Blush, MD    Current Facility-Administered Medications  Medication Dose Route Frequency Provider Last Rate Last Admin   0.9 %  sodium chloride   infusion   Intravenous Continuous Jannette Mend, Mir M, MD 100 mL/hr at 03/25/24 0200 Infusion Verify at 03/25/24 0200   acetaminophen  (TYLENOL ) tablet 650 mg  650 mg Oral Q6H PRN Jannette Mend, Mir M, MD       Or   acetaminophen  (TYLENOL ) suppository 650 mg  650 mg Rectal Q6H PRN Jannette Mend, Mir M, MD       albuterol (PROVENTIL) (2.5 MG/3ML) 0.083% nebulizer solution 2.5 mg  2.5 mg Nebulization Q6H PRN Jannette Mend, Mir M, MD       gabapentin (NEURONTIN) capsule 100 mg  100 mg Oral TID Ikramullah, Mir M, MD       HYDROmorphone (DILAUDID) injection 0.5 mg  0.5 mg Intravenous Q3H PRN Jannette Mend, Mir M, MD   0.5 mg at 03/25/24 1059   LORazepam (ATIVAN) injection 1 mg  1 mg Intravenous Q4H PRN Jannette Mend, Mir M, MD       nystatin cream (MYCOSTATIN)   Topical BID Jannette Mend, Mir M, MD   Given at 03/25/24 1108   ondansetron  (ZOFRAN ) tablet 4 mg  4 mg Oral Q6H PRN Jannette Mend, Mir M, MD       Or   ondansetron  (ZOFRAN ) injection 4 mg  4 mg Intravenous Q6H PRN Jannette Mend, Mir M, MD   4 mg at 03/24/24 1725   pantoprazole (PROTONIX) injection 40 mg  40 mg Intravenous Q24H Jannette Mend, Mir M, MD   40 mg at 03/24/24 2118   sucralfate  (CARAFATE ) tablet 1 g  1 g Oral TID WC & HS Gaylin Ke, MD  1 g at 03/24/24 2118   traZODone (DESYREL) tablet 25 mg  25 mg Oral QHS PRN Gaylin Ke, MD        Allergies as of 03/24/2024 - Review Complete 03/24/2024  Allergen Reaction Noted   Penicillins Hives 04/29/2018    Family History  Problem Relation Age of Onset   Iron deficiency Mother     Social History   Socioeconomic History   Marital status: Married    Spouse name: Not on file   Number of children: Not on file   Years of education: Not on file   Highest education level: Not on file  Occupational History   Not on file  Tobacco Use   Smoking status: Every Day   Smokeless tobacco: Never  Vaping Use   Vaping status: Never Used  Substance and Sexual Activity   Alcohol use: Not Currently   Drug  use: Yes    Types: Marijuana   Sexual activity: Not on file  Other Topics Concern   Not on file  Social History Narrative   Not on file   Social Drivers of Health   Financial Resource Strain: Not on file  Food Insecurity: Patient Declined (03/24/2024)   Hunger Vital Sign    Worried About Running Out of Food in the Last Year: Patient declined    Ran Out of Food in the Last Year: Patient declined  Transportation Needs: Patient Declined (03/24/2024)   PRAPARE - Administrator, Civil Service (Medical): Patient declined    Lack of Transportation (Non-Medical): Patient declined  Physical Activity: Not on file  Stress: Not on file  Social Connections: Patient Declined (03/24/2024)   Social Connection and Isolation Panel [NHANES]    Frequency of Communication with Friends and Family: Patient declined    Frequency of Social Gatherings with Friends and Family: Patient declined    Attends Religious Services: Patient declined    Active Member of Clubs or Organizations: Patient declined    Attends Banker Meetings: Patient declined    Marital Status: Patient declined  Intimate Partner Violence: Patient Declined (03/24/2024)   Humiliation, Afraid, Rape, and Kick questionnaire    Fear of Current or Ex-Partner: Patient declined    Emotionally Abused: Patient declined    Physically Abused: Patient declined    Sexually Abused: Patient declined    Review of Systems: As per HPI, all others negative  Physical Exam: Vital signs in last 24 hours: Temp:  [98.1 F (36.7 C)-99.6 F (37.6 C)] 98.6 F (37 C) (06/08 0436) Pulse Rate:  [57-78] 57 (06/08 0436) Resp:  [18-24] 18 (06/08 0436) BP: (144-156)/(73-90) 147/79 (06/08 0436) SpO2:  [100 %] 100 % (06/08 0436) Last BM Date : 03/23/24 General:   Alert,  Well-developed, well-nourished, pleasant and cooperative in NAD Head:  Normocephalic and atraumatic. Eyes:  Sclera clear, no icterus.   Conjunctiva pink. Ears:  Normal  auditory acuity. Nose:  No deformity, discharge,  or lesions. Mouth:  No deformity or lesions.  Oropharynx pink & moist. Neck:  Supple; no masses or thyromegaly. Lungs:  No visible respiratory distress Abdomen:  Soft, nondistended, diffuse upper abdominal tenderness without peritonitis, No masses, hepatosplenomegaly or hernias noted. No peritonitis  Msk:  Symmetrical without gross deformities. Normal posture. Pulses:  Normal pulses noted. Extremities:  Without clubbing or edema. Neurologic:  Alert and  oriented x4;  grossly normal neurologically. Skin:  Intact without significant lesions or rashes. Psych:  Alert and cooperative. Normal mood and affect.  Lab Results: Recent Labs    03/24/24 0109  WBC 8.6  HGB 9.8*  HCT 27.7*  PLT 204   BMET Recent Labs    03/24/24 0109 03/25/24 1030  NA 142 140  K 3.6 3.3*  CL 107 111  CO2 17* 19*  GLUCOSE 108* 77  BUN 11 10  CREATININE 1.11* 0.85  CALCIUM 10.0 8.6*   LFT Recent Labs    03/24/24 0109  PROT 7.6  ALBUMIN 4.7  AST 24  ALT 15  ALKPHOS 82  BILITOT 1.2   PT/INR No results for input(s): "LABPROT", "INR" in the last 72 hours.  Studies/Results: CT Angio Chest/Abd/Pel for Dissection W and/or Wo Contrast Result Date: 03/24/2024 CLINICAL DATA:  Chest and back pain, initial encounter EXAM: CT ANGIOGRAPHY CHEST, ABDOMEN AND PELVIS TECHNIQUE: Non-contrast CT of the chest was initially obtained. Multidetector CT imaging through the chest, abdomen and pelvis was performed using the standard protocol during bolus administration of intravenous contrast. Multiplanar reconstructed images and MIPs were obtained and reviewed to evaluate the vascular anatomy. RADIATION DOSE REDUCTION: This exam was performed according to the departmental dose-optimization program which includes automated exposure control, adjustment of the mA and/or kV according to patient size and/or use of iterative reconstruction technique. CONTRAST:  OMNIPAQUE   IOHEXOL  350 MG/ML SOLN COMPARISON:  Chest x-ray from earlier in the same day. FINDINGS: CTA CHEST FINDINGS Cardiovascular: Initial precontrast images show no hyperdense crescent to suggest acute aortic injury. Postcontrast images show no aneurysmal dilatation of the aorta. No dissection is noted. The heart is not significantly enlarged. Pulmonary artery shows a normal branching pattern bilaterally. No intraluminal filling defect to suggest pulmonary embolism is noted. No significant coronary calcifications are noted. Mediastinum/Nodes: Thoracic inlet is within normal limits. No hilar or mediastinal adenopathy is noted. The esophagus as visualized is within normal limits. Lungs/Pleura: Lungs are well aerated bilaterally. No focal infiltrate is noted. Stable atelectasis is seen at the right base. No parenchymal nodules are seen. Musculoskeletal: No acute rib abnormality is noted. Review of the MIP images confirms the above findings. CTA ABDOMEN AND PELVIS FINDINGS VASCULAR Aorta: Abdominal aorta shows no aneurysmal dilatation or dissection. Mild atherosclerotic calcifications are seen. Celiac: Patent without evidence of aneurysm, dissection, vasculitis or significant stenosis. SMA: Patent without evidence of aneurysm, dissection, vasculitis or significant stenosis. Renals: Both renal arteries are patent without evidence of aneurysm, dissection, vasculitis, fibromuscular dysplasia or significant stenosis. IMA: Patent without evidence of aneurysm, dissection, vasculitis or significant stenosis. Inflow: Iliacs show mild atherosclerotic calcification without focal stenosis or dilatation. Veins: No specific venous abnormality is noted. Review of the MIP images confirms the above findings. NON-VASCULAR Hepatobiliary: Liver is within normal limits. Vicarious excretion of contrast in the gallbladder. No discrete stones are seen at this time. Pancreas: Unremarkable. No pancreatic ductal dilatation or surrounding inflammatory  changes. Spleen: Normal in size without focal abnormality. Adrenals/Urinary Tract: Adrenal glands are within normal limits. Kidneys demonstrate no renal calculi or obstructive changes. The bladder is decompressed. Stomach/Bowel: The appendix is within normal limits. No obstructive or inflammatory changes of the colon are seen. Small bowel and stomach are within normal limits. Lymphatic: No lymphadenopathy is seen. Reproductive: Uterus and bilateral adnexa are unremarkable. Other: No abdominal wall hernia or abnormality. No abdominopelvic ascites. Musculoskeletal: No acute or significant osseous findings. Review of the MIP images confirms the above findings. IMPRESSION: CTA of the chest: No evidence of aneurysmal dilatation or dissection. Minimal right basilar atelectasis. CTA of the abdomen and pelvis: No acute  aortic abnormality is noted. No acute abnormality noted. Electronically Signed   By: Violeta Grey M.D.   On: 03/24/2024 02:44   DG Chest Portable 1 View Result Date: 03/24/2024 CLINICAL DATA:  Chest pain EXAM: PORTABLE CHEST 1 VIEW COMPARISON:  03/22/2024 FINDINGS: The heart size and mediastinal contours are within normal limits. Both lungs are clear. The visualized skeletal structures are unremarkable. IMPRESSION: No active disease. Electronically Signed   By: Juanetta Nordmann M.D.   On: 03/24/2024 02:17    Impression:   Abdominal pain.  Constant x 2 months.  Worse with movement and eating.  Unclear etiology but pain does not sound typical of gallbladder process. Gallstones.  Not convinced this is source of patient's abdominal pain.  Plan:   PPI and sucralfate . Soft low fat diet, NPO after midnight. Endoscopy tomorrow, pending anesthesia/endoscopy availability. Risks (bleeding, infection, bowel perforation that could require surgery, sedation-related changes in cardiopulmonary systems), benefits (identification and possible treatment of source of symptoms, exclusion of certain causes of  symptoms), and alternatives (watchful waiting, radiographic imaging studies, empiric medical treatment) of upper endoscopy (EGD) were explained to patient/family in detail and patient wishes to proceed.    LOS: 0 days   Estefani Bateson M  03/25/2024, 11:58 AM  Cell [phone] If no answer or after 5 PM call [phone]

## 2024-03-25 NOTE — Progress Notes (Signed)
 Patient c/o bilateral posterior cheeks/neck feeling "swollen/puffy." Per patient, this began prior to admission. Patient's husband agrees this is not baseline. Areas tender to palpation, not firm to touch. No redness or heat noted. MD Duard Getting made aware of concerns.

## 2024-03-25 NOTE — Progress Notes (Signed)
 PROGRESS NOTE    Eryca Bolte  NWG:[id number] DOB: 1971-03-11 DOA: 03/24/2024 PCP: Gail Joseph, FNP   Brief Narrative: Briony Parveen is a 53 y.o. female with a history of hypertension and hypothyroidism.  Patient presented secondary to intractable abdominal pain with nausea and vomiting. GI consulted.   Assessment and Plan:  Intractable abdominal pain Nausea and vomiting Unclear etiology. Patient was seen by general surgery days prior for concern for gallbladder etiology, which was ruled out. Persistent symptoms concerning for possible gastric source of pain. Patient reports a history of NSAID use. [facility] consulted. [facility] recommendations: plan for upper endoscopy -Continue Zofran  as needed -Continue analgesics as needed  Primary hypertension Patient is on amlodipine as an outpatient.  Amlodipine continued on admission.  Blood pressure currently uncontrolled. - Continue amlodipine  Hypothyroidism -Continue Synthroid  Macrocytic anemia No recent lab values available. Unclear of chronicity. Hemoglobin of 10 on admission with MCV of 111.5. Concern for vitamin B12 and/or folate deficiency. No evidence of bleeding. -Trend CBC -Folate and vitamin B12   Peripheral polyneuropathy Non-focal. Unclear etiology. Patient with macrocytosis, which could indicated vitamin B12 deficiency. -Check Vitamin B12, folate   DVT prophylaxis: SCDs Code Status:   Code Status: Full Code Family Communication: Husband at bedside Disposition Plan: Discharge pending ongoing GI recommendations   Consultants:  [facility]  Procedures:  None  Antimicrobials: None    Subjective: Patient reports continuing to feel ill. No emesis this morning. Ongoing abdominal pain.  Objective: BP (!) 147/79 (BP Location: Right Arm)   Pulse (!) 57   Temp 98.6 F (37 C) (Oral)   Resp 18   Ht 5\' 4"  (1.626 m)   Wt 72.6 kg   LMP  (LMP Unknown)   SpO2 100%   BMI 27.46 kg/m    Examination:  General exam: Appears calm. Respiratory system: Clear to auscultation. Respiratory effort normal. Cardiovascular system: S1 & S2 heard, RRR. No murmurs, rubs, gallops or clicks. Gastrointestinal system: Abdomen is nondistended, soft and with significant epigastric tenderness. Normal bowel sounds heard. Central nervous system: Alert and oriented. No focal neurological deficits. Musculoskeletal: No edema. No calf tenderness Psychiatry: Judgement and insight appear normal. Mood & affect appropriate.    Data Reviewed: I have personally reviewed following labs and imaging studies  CBC Lab Results  Component Value Date   WBC 8.6 03/24/2024   RBC 2.57 (L) 03/24/2024   HGB 9.8 (L) 03/24/2024   HCT 27.7 (L) 03/24/2024   MCV 107.8 (H) 03/24/2024   MCH 38.1 (H) 03/24/2024   PLT 204 03/24/2024   MCHC 35.4 03/24/2024   RDW 16.8 (H) 03/24/2024   LYMPHSABS 3.1 03/24/2024   MONOABS 0.2 03/24/2024   EOSABS 0.0 03/24/2024   BASOSABS 0.0 03/24/2024     Last metabolic panel Lab Results  Component Value Date   NA 142 03/24/2024   K 3.6 03/24/2024   CL 107 03/24/2024   CO2 17 (L) 03/24/2024   BUN 11 03/24/2024   CREATININE 1.11 (H) 03/24/2024   GLUCOSE 108 (H) 03/24/2024   GFRNONAA 59 (L) 03/24/2024   GFRAA >60 09/01/2018   CALCIUM 10.0 03/24/2024   PROT 7.6 03/24/2024   ALBUMIN 4.7 03/24/2024   BILITOT 1.2 03/24/2024   ALKPHOS 82 03/24/2024   AST 24 03/24/2024   ALT 15 03/24/2024   ANIONGAP 18 (H) 03/24/2024    GFR: Estimated Creatinine Clearance: 56.6 mL/min (A) (by C-G formula based on SCr of 1.11 mg/dL (H)).  No results found for  this or any previous visit (from the past 240 hours).    Radiology Studies: CT Angio Chest/Abd/Pel for Dissection W and/or Wo Contrast Result Date: 03/24/2024 CLINICAL DATA:  Chest and back pain, initial encounter EXAM: CT ANGIOGRAPHY CHEST, ABDOMEN AND PELVIS TECHNIQUE: Non-contrast CT of the chest was initially obtained.  Multidetector CT imaging through the chest, abdomen and pelvis was performed using the standard protocol during bolus administration of intravenous contrast. Multiplanar reconstructed images and MIPs were obtained and reviewed to evaluate the vascular anatomy. RADIATION DOSE REDUCTION: This exam was performed according to the departmental dose-optimization program which includes automated exposure control, adjustment of the mA and/or kV according to patient size and/or use of iterative reconstruction technique. CONTRAST:  OMNIPAQUE  IOHEXOL  350 MG/ML SOLN COMPARISON:  Chest x-ray from earlier in the same day. FINDINGS: CTA CHEST FINDINGS Cardiovascular: Initial precontrast images show no hyperdense crescent to suggest acute aortic injury. Postcontrast images show no aneurysmal dilatation of the aorta. No dissection is noted. The heart is not significantly enlarged. Pulmonary artery shows a normal branching pattern bilaterally. No intraluminal filling defect to suggest pulmonary embolism is noted. No significant coronary calcifications are noted. Mediastinum/Nodes: Thoracic inlet is within normal limits. No hilar or mediastinal adenopathy is noted. The esophagus as visualized is within normal limits. Lungs/Pleura: Lungs are well aerated bilaterally. No focal infiltrate is noted. Stable atelectasis is seen at the right base. No parenchymal nodules are seen. Musculoskeletal: No acute rib abnormality is noted. Review of the MIP images confirms the above findings. CTA ABDOMEN AND PELVIS FINDINGS VASCULAR Aorta: Abdominal aorta shows no aneurysmal dilatation or dissection. Mild atherosclerotic calcifications are seen. Celiac: Patent without evidence of aneurysm, dissection, vasculitis or significant stenosis. SMA: Patent without evidence of aneurysm, dissection, vasculitis or significant stenosis. Renals: Both renal arteries are patent without evidence of aneurysm, dissection, vasculitis, fibromuscular dysplasia or  significant stenosis. IMA: Patent without evidence of aneurysm, dissection, vasculitis or significant stenosis. Inflow: Iliacs show mild atherosclerotic calcification without focal stenosis or dilatation. Veins: No specific venous abnormality is noted. Review of the MIP images confirms the above findings. NON-VASCULAR Hepatobiliary: Liver is within normal limits. Vicarious excretion of contrast in the gallbladder. No discrete stones are seen at this time. Pancreas: Unremarkable. No pancreatic ductal dilatation or surrounding inflammatory changes. Spleen: Normal in size without focal abnormality. Adrenals/Urinary Tract: Adrenal glands are within normal limits. Kidneys demonstrate no renal calculi or obstructive changes. The bladder is decompressed. Stomach/Bowel: The appendix is within normal limits. No obstructive or inflammatory changes of the colon are seen. Small bowel and stomach are within normal limits. Lymphatic: No lymphadenopathy is seen. Reproductive: Uterus and bilateral adnexa are unremarkable. Other: No abdominal wall hernia or abnormality. No abdominopelvic ascites. Musculoskeletal: No acute or significant osseous findings. Review of the MIP images confirms the above findings. IMPRESSION: CTA of the chest: No evidence of aneurysmal dilatation or dissection. Minimal right basilar atelectasis. CTA of the abdomen and pelvis: No acute aortic abnormality is noted. No acute abnormality noted. Electronically Signed   By: Violeta Grey M.D.   On: 03/24/2024 02:44   DG Chest Portable 1 View Result Date: 03/24/2024 CLINICAL DATA:  Chest pain EXAM: PORTABLE CHEST 1 VIEW COMPARISON:  03/22/2024 FINDINGS: The heart size and mediastinal contours are within normal limits. Both lungs are clear. The visualized skeletal structures are unremarkable. IMPRESSION: No active disease. Electronically Signed   By: Juanetta Nordmann M.D.   On: 03/24/2024 02:17      LOS: 0 days  Aneita Keens, MD [facility] 03/25/2024, 8:51 AM   If 7PM-7AM, please contact night-coverage [email or web address]

## 2024-03-26 ENCOUNTER — Encounter (HOSPITAL_COMMUNITY): Payer: Self-pay | Admitting: Family Medicine

## 2024-03-26 ENCOUNTER — Observation Stay (HOSPITAL_COMMUNITY): Admitting: Anesthesiology

## 2024-03-26 ENCOUNTER — Encounter (HOSPITAL_COMMUNITY)
Admission: EM | Disposition: A | Discharge status: Home Health | Payer: Self-pay | Source: Home / Self Care | Attending: Family Medicine

## 2024-03-26 ENCOUNTER — Observation Stay (HOSPITAL_COMMUNITY)

## 2024-03-26 DIAGNOSIS — G629 Polyneuropathy, unspecified: Secondary | ICD-10-CM | POA: Diagnosis not present

## 2024-03-26 DIAGNOSIS — E538 Deficiency of other specified B group vitamins: Secondary | ICD-10-CM | POA: Diagnosis not present

## 2024-03-26 DIAGNOSIS — R1013 Epigastric pain: Secondary | ICD-10-CM | POA: Diagnosis not present

## 2024-03-26 DIAGNOSIS — K3189 Other diseases of stomach and duodenum: Secondary | ICD-10-CM

## 2024-03-26 DIAGNOSIS — I10 Essential (primary) hypertension: Secondary | ICD-10-CM

## 2024-03-26 HISTORY — PX: ESOPHAGOGASTRODUODENOSCOPY: SHX5428

## 2024-03-26 LAB — POTASSIUM: Potassium: 3.3 mmol/L — ABNORMAL LOW (ref 3.5–5.1)

## 2024-03-26 LAB — BASIC METABOLIC PANEL WITH GFR
Anion gap: 7 (ref 5–15)
BUN: 7 mg/dL (ref 6–20)
CO2: 24 mmol/L (ref 22–32)
Calcium: 8.6 mg/dL — ABNORMAL LOW (ref 8.9–10.3)
Chloride: 106 mmol/L (ref 98–111)
Creatinine, Ser: 0.96 mg/dL (ref 0.44–1.00)
GFR, Estimated: >60 mL/min (ref >60)
Glucose, Bld: 92 mg/dL (ref 70–99)
Potassium: 3.4 mmol/L — ABNORMAL LOW (ref 3.5–5.1)
Sodium: 137 mmol/L (ref 135–145)

## 2024-03-26 LAB — MAGNESIUM: Magnesium: 1.8 mg/dL (ref 1.7–2.4)

## 2024-03-26 SURGERY — EGD (ESOPHAGOGASTRODUODENOSCOPY)
Anesthesia: Monitor Anesthesia Care | Laterality: Left

## 2024-03-26 MED ORDER — SODIUM CHLORIDE (PF) 0.9 % IJ SOLN
INTRAMUSCULAR | Status: AC
  Filled 2024-03-26: qty 50

## 2024-03-26 MED ORDER — LIDOCAINE 2% (20 MG/ML) 5 ML SYRINGE
INTRAMUSCULAR | Status: DC | PRN
  Administered 2024-03-26: 40 mg via INTRAVENOUS
  Administered 2024-03-26: 60 mg via INTRAVENOUS

## 2024-03-26 MED ORDER — HYDROMORPHONE HCL 1 MG/ML IJ SOLN
0.5000 mg | INTRAMUSCULAR | Status: DC | PRN
  Administered 2024-03-26: 0.5 mg via INTRAVENOUS
  Filled 2024-03-26: qty 0.5

## 2024-03-26 MED ORDER — DICYCLOMINE HCL 20 MG PO TABS
20.0000 mg | ORAL_TABLET | Freq: Three times a day (TID) | ORAL | Status: DC
  Administered 2024-03-26 – 2024-03-29 (×11): 20 mg via ORAL
  Filled 2024-03-26 (×12): qty 1

## 2024-03-26 MED ORDER — LEVOTHYROXINE SODIUM 125 MCG PO TABS: 125.0000 ug | ORAL_TABLET | ORAL | Status: DC

## 2024-03-26 MED ORDER — HYDROCODONE-ACETAMINOPHEN 5-325 MG PO TABS
1.0000 | ORAL_TABLET | ORAL | Status: DC | PRN
  Administered 2024-03-27 – 2024-03-28 (×5): 1 via ORAL
  Filled 2024-03-26 (×5): qty 1

## 2024-03-26 MED ORDER — IOHEXOL 300 MG/ML  SOLN
75.0000 mL | Freq: Once | INTRAMUSCULAR | Status: AC | PRN
  Administered 2024-03-26: 75 mL via INTRAVENOUS

## 2024-03-26 MED ORDER — SODIUM CHLORIDE 0.9 % IV SOLN
INTRAVENOUS | Status: AC | PRN
  Administered 2024-03-26: 500 mL via INTRAVENOUS

## 2024-03-26 MED ORDER — POTASSIUM CHLORIDE CRYS ER 20 MEQ PO TBCR
40.0000 meq | EXTENDED_RELEASE_TABLET | Freq: Once | ORAL | Status: AC
  Administered 2024-03-26: 40 meq via ORAL
  Filled 2024-03-26: qty 2

## 2024-03-26 MED ORDER — SUCRALFATE 1 GM/10ML PO SUSP
1.0000 g | Freq: Three times a day (TID) | ORAL | Status: DC
  Administered 2024-03-26 – 2024-03-29 (×11): 1 g via ORAL
  Filled 2024-03-26 (×11): qty 10

## 2024-03-26 MED ORDER — LEVOTHYROXINE SODIUM 125 MCG PO TABS
62.5000 ug | ORAL_TABLET | ORAL | Status: DC
  Administered 2024-03-27 – 2024-03-29 (×3): 62.5 ug via ORAL
  Filled 2024-03-26 (×3): qty 1

## 2024-03-26 MED ORDER — PROPOFOL 10 MG/ML IV BOLUS
INTRAVENOUS | Status: AC
  Filled 2024-03-26: qty 20

## 2024-03-26 MED ORDER — LORAZEPAM 1 MG PO TABS
1.0000 mg | ORAL_TABLET | Freq: Four times a day (QID) | ORAL | Status: DC | PRN
  Administered 2024-03-26 – 2024-03-28 (×2): 1 mg via ORAL
  Filled 2024-03-26 (×2): qty 1

## 2024-03-26 MED ORDER — POTASSIUM CHLORIDE CRYS ER 20 MEQ PO TBCR
40.0000 meq | EXTENDED_RELEASE_TABLET | ORAL | Status: AC
  Administered 2024-03-26 – 2024-03-27 (×2): 40 meq via ORAL
  Filled 2024-03-26 (×2): qty 2

## 2024-03-26 MED ORDER — PROPOFOL 10 MG/ML IV BOLUS
INTRAVENOUS | Status: DC | PRN
  Administered 2024-03-26: 60 mg via INTRAVENOUS
  Administered 2024-03-26: 80 mg via INTRAVENOUS
  Administered 2024-03-26: 140 mg via INTRAVENOUS

## 2024-03-26 MED ORDER — MAGNESIUM SULFATE 2 GM/50ML IV SOLN
2.0000 g | Freq: Once | INTRAVENOUS | Status: AC
  Administered 2024-03-26: 2 g via INTRAVENOUS
  Filled 2024-03-26: qty 50

## 2024-03-26 MED ORDER — PANTOPRAZOLE SODIUM 40 MG IV SOLR
40.0000 mg | Freq: Two times a day (BID) | INTRAVENOUS | Status: DC
  Administered 2024-03-26 – 2024-03-28 (×5): 40 mg via INTRAVENOUS
  Filled 2024-03-26 (×5): qty 10

## 2024-03-26 NOTE — Op Note (Signed)
 [facility] Patient Name: Maria Wright Procedure Date: 03/26/2024 MRN: [id number] Attending MD: Genell Ken , MD, [id number] Date of Birth: [birth year] CSN: [id number] Age: 53 Admit Type: Inpatient Procedure:                Upper GI endoscopy Indications:              Epigastric abdominal pain Providers:                Genell Ken, MD, Bradley Caffey, Nohemi Batters,                            Technician Referring MD:             [facility] Medicines:                Monitored Anesthesia Care Complications:            No immediate complications. Estimated blood loss:                            Minimal. Estimated Blood Loss:     Estimated blood loss was minimal. Procedure:                Pre-Anesthesia Assessment:                           - Prior to the procedure, a History and Physical                            was performed, and patient medications and                            allergies were reviewed. The patient's tolerance of                            previous anesthesia was also reviewed. The risks                            and benefits of the procedure and the sedation                            options and risks were discussed with the patient.                            All questions were answered, and informed consent                            was obtained. Prior Anticoagulants: The patient has                            taken no anticoagulant or antiplatelet agents. ASA                            Grade Assessment: II - A patient with mild systemic  disease. After reviewing the risks and benefits,                            the patient was deemed in satisfactory condition to                            undergo the procedure.                           After obtaining informed consent, the endoscope was                            passed under direct vision. Throughout the                            procedure, the patient's blood  pressure, pulse, and                            oxygen saturations were monitored continuously. The                            [id number]-[id number] ([id number]) Olympus endoscope was introduced                            through the mouth, and advanced to the second part                            of duodenum. The upper GI endoscopy was                            accomplished without difficulty. The patient                            tolerated the procedure well. Scope In: Scope Out: Findings:      The examined esophagus was normal.      The Z-line was regular and was found 38 cm from the incisors.      Patchy moderately erythematous mucosa without bleeding was found in the       gastric body and in the gastric antrum. Biopsies were taken with a cold       forceps for Helicobacter pylori testing.      The cardia and gastric fundus were normal on retroflexion.      The examined duodenum was normal. Biopsies for histology were taken with       a cold forceps for evaluation of celiac disease. Impression:               - Normal esophagus.                           - Z-line regular, 38 cm from the incisors.                           - Erythematous mucosa in the gastric body and  antrum. Biopsied.                           - Normal examined duodenum. Biopsied. Moderate Sedation:      Patient did not receive moderate sedation for this procedure, but       instead received monitored anesthesia care. Recommendation:           - Advance diet as tolerated.                           - Continue present medications.                           - Await pathology results.                           - Dicyclomine 20 mg up to every 6 hours as needed                            for pain.                           - Outpatient screening colonoscopy. Procedure Code(s):        --- Professional ---                           [phone], Esophagogastroduodenoscopy, flexible,                             transoral; with biopsy, single or multiple Diagnosis Code(s):        --- Professional ---                           K31.89, Other diseases of stomach and duodenum                           R10.13, Epigastric pain CPT copyright 2022 American [employer] Association. All rights reserved. The codes documented in this report are preliminary and upon coder review may  be revised to meet current compliance requirements. Genell Ken, MD 03/26/2024 12:30:21 PM This report has been signed electronically. Number of Addenda: 0

## 2024-03-26 NOTE — Anesthesia Preprocedure Evaluation (Addendum)
 Anesthesia Evaluation  Patient identified by MRN, date of birth, ID band Patient awake    Reviewed: Allergy & Precautions, H&P , NPO status , Patient's Chart, lab work & pertinent test results  Airway Mallampati: II  TM Distance: >3 FB Neck ROM: Full    Dental no notable dental hx. (+) Teeth Intact, Dental Advisory Given   Pulmonary Current Smoker   Pulmonary exam normal breath sounds clear to auscultation       Cardiovascular negative cardio ROS  Rhythm:Regular Rate:Normal     Neuro/Psych negative neurological ROS  negative psych ROS   GI/Hepatic negative GI ROS, Neg liver ROS,,,  Endo/Other  negative endocrine ROS    Renal/GU negative Renal ROS  negative genitourinary   Musculoskeletal   Abdominal   Peds  Hematology  (+) Blood dyscrasia, anemia   Anesthesia Other Findings   Reproductive/Obstetrics negative OB ROS                             Anesthesia Physical Anesthesia Plan  ASA: 2  Anesthesia Plan: MAC   Post-op Pain Management: Minimal or no pain anticipated   Induction: Intravenous  PONV Risk Score and Plan: 1 and Propofol infusion and Treatment may vary due to age or medical condition  Airway Management Planned: Natural Airway and Simple Face Mask  Additional Equipment:   Intra-op Plan:   Post-operative Plan:   Informed Consent: I have reviewed the patients History and Physical, chart, labs and discussed the procedure including the risks, benefits and alternatives for the proposed anesthesia with the patient or authorized representative who has indicated his/her understanding and acceptance.     Dental advisory given  Plan Discussed with: CRNA  Anesthesia Plan Comments:        Anesthesia Quick Evaluation

## 2024-03-26 NOTE — Progress Notes (Signed)
 PROGRESS NOTE    Maria Wright  AVW:[id number] DOB: 1971/08/13 DOA: 03/24/2024 PCP: Gail Joseph, FNP   Brief Narrative: Maria Wright is a 53 y.o. female with a history of hypertension and hypothyroidism.  Patient presented secondary to intractable abdominal pain with nausea and vomiting. GI consulted with plan for upper endoscopy. Vitamin B12 deficiency noted; started on vitamin B12 IM supplementation.   Assessment and Plan:  Intractable abdominal pain Nausea and vomiting Unclear etiology. Patient was seen by general surgery days prior for concern for gallbladder etiology, which was ruled out. Persistent symptoms concerning for possible gastric source of pain. Patient reports a history of NSAID use. [facility] recommendations: plan for upper endoscopy -Continue Zofran  as needed -Continue analgesics as needed  Primary hypertension Patient is on amlodipine as an outpatient.  Amlodipine continued on admission.  Blood pressure currently uncontrolled. - Continue amlodipine  Hypothyroidism -Continue Synthroid  Macrocytic anemia No recent lab values available. Unclear of chronicity. Hemoglobin of 10 on admission with MCV of 111.5. No evidence of bleeding. Vitamin B12 deficiency confirmed.  Vitamin B12 deficiency Vitamin B12 of 74. Vitamin B12 1000 IM started. -Continue Vitamin B12 1000 IM daily x7, followed by continued IM supplementation -Check intrinsic factor  Bilateral neck swelling Likely parotiditis. Does not appear to be infection present but patient does have symptoms of tenderness. -Check CT soft tissue -Check EBV/CMV  Peripheral polyneuropathy Non-focal. Unclear etiology. Patient with macrocytosis and skin hyperpigmentation. Vitamin B12 deficiency confirmed. Patient declining gabapentin at this time secondary to concerns of side effects. -Continue Gabapentin if patient desires   DVT prophylaxis: SCDs Code Status:   Code Status: Full  Code Family Communication: Husband at bedside. Mother at bedside Disposition Plan: Discharge pending ongoing GI recommendations   Consultants:  [facility]  Procedures:  None  Antimicrobials: None    Subjective: Patient reports feeling somewhat better, but still not really better; it is hard for her to qualify. She reports emesis yesterday.  Objective: BP 139/78 (BP Location: Left Arm)   Pulse 84   Temp 97.9 F (36.6 C) (Oral)   Resp 12   Ht 5\' 4"  (1.626 m)   Wt 72.6 kg   LMP  (LMP Unknown)   SpO2 99%   BMI 27.46 kg/m   Examination:  General exam: Appears calm and comfortable Respiratory system: Clear to auscultation. Respiratory effort normal. Cardiovascular system: S1 & S2 heard, RRR. No murmurs. Gastrointestinal system: Abdomen is nondistended, soft and nontender. Normal bowel sounds heard. Central nervous system: Alert and oriented. No focal neurological deficits. Musculoskeletal: No edema. No calf tenderness Skin: Hands with hyperpigmentation Psychiatry: Judgement and insight appear normal. Mood & affect appropriate.    Data Reviewed: I have personally reviewed following labs and imaging studies  CBC Lab Results  Component Value Date   WBC 8.6 03/24/2024   RBC 2.57 (L) 03/24/2024   HGB 9.8 (L) 03/24/2024   HCT 27.7 (L) 03/24/2024   MCV 107.8 (H) 03/24/2024   MCH 38.1 (H) 03/24/2024   PLT 204 03/24/2024   MCHC 35.4 03/24/2024   RDW 16.8 (H) 03/24/2024   LYMPHSABS 3.1 03/24/2024   MONOABS 0.2 03/24/2024   EOSABS 0.0 03/24/2024   BASOSABS 0.0 03/24/2024     Last metabolic panel Lab Results  Component Value Date   NA 137 03/26/2024   K 3.4 (L) 03/26/2024   CL 106 03/26/2024   CO2 24 03/26/2024   BUN 7 03/26/2024   CREATININE 0.96 03/26/2024   GLUCOSE  92 03/26/2024   GFRNONAA >60 03/26/2024   GFRAA >60 09/01/2018   CALCIUM 8.6 (L) 03/26/2024   PROT 7.6 03/24/2024   ALBUMIN 4.7 03/24/2024   BILITOT 1.2 03/24/2024   ALKPHOS 82  03/24/2024   AST 24 03/24/2024   ALT 15 03/24/2024   ANIONGAP 7 03/26/2024    GFR: Estimated Creatinine Clearance: 65.5 mL/min (by C-G formula based on SCr of 0.96 mg/dL).  No results found for this or any previous visit (from the past 240 hours).    Radiology Studies: No results found.     LOS: 0 days    Aneita Keens, MD [facility] 03/26/2024, 9:20 AM   If 7PM-7AM, please contact night-coverage [email or web address]

## 2024-03-26 NOTE — Progress Notes (Signed)
   03/26/24 1014  TOC Brief Assessment  Insurance and Status Reviewed  Patient has primary care physician Yes  Home environment has been reviewed home with spouse  Prior level of function: independent  Prior/Current Home Services No current home services  Social Drivers of Health Review SDOH reviewed no interventions necessary  Readmission risk has been reviewed Yes  Transition of care needs no transition of care needs at this time

## 2024-03-26 NOTE — Anesthesia Procedure Notes (Signed)
 Procedure Name: MAC Date/Time: 03/26/2024 12:09 PM  Performed by: Mervyn Ace, CRNAPre-anesthesia Checklist: Patient identified, Emergency Drugs available, Suction available, Patient being monitored and Timeout performed Patient Re-evaluated:Patient Re-evaluated prior to induction Oxygen Delivery Method: Simple face mask Preoxygenation: Pre-oxygenation with 100% oxygen (POM mask utilized) Induction Type: IV induction Dental Injury: Teeth and Oropharynx as per pre-operative assessment

## 2024-03-26 NOTE — Interval H&P Note (Signed)
 History and Physical Interval Note: 52/female with epigastric pain for an egd with propofol.  03/26/2024 11:49 AM  Maria Wright  has presented today for Egd with propofol, with the diagnosis of abdominal pain.  The various methods of treatment have been discussed with the patient and family. After consideration of risks, benefits and other options for treatment, the patient has consented to  Procedure(s): EGD (ESOPHAGOGASTRODUODENOSCOPY) (Left) as a surgical intervention.  The patient's history has been reviewed, patient examined, no change in status, stable for surgery.  I have reviewed the patient's chart and labs.  Questions were answered to the patient's satisfaction.     Genell Ken

## 2024-03-26 NOTE — Anesthesia Postprocedure Evaluation (Signed)
 Anesthesia Post Note  Patient: Maria Wright  Procedure(s) Performed: EGD (ESOPHAGOGASTRODUODENOSCOPY) (Left)     Patient location during evaluation: Endoscopy Anesthesia Type: MAC Level of consciousness: awake and alert Pain management: pain level controlled Vital Signs Assessment: post-procedure vital signs reviewed and stable Respiratory status: spontaneous breathing, nonlabored ventilation and respiratory function stable Cardiovascular status: stable and blood pressure returned to baseline Postop Assessment: no apparent nausea or vomiting Anesthetic complications: no  No notable events documented.  Last Vitals:  Vitals:   03/26/24 1240 03/26/24 1250  BP: 139/68 139/72  Pulse: 91 66  Resp: 16 13  Temp:    SpO2: 96% 99%    Last Pain:  Vitals:   03/26/24 1250  TempSrc:   PainSc: 3                  Marquavion Venhuizen,W. EDMOND

## 2024-03-26 NOTE — Transfer of Care (Signed)
 Immediate Anesthesia Transfer of Care Note  Patient: Maria Wright  Procedure(s) Performed: EGD (ESOPHAGOGASTRODUODENOSCOPY) (Left)  Patient Location: Endoscopy Unit  Anesthesia Type:MAC  Level of Consciousness: awake, alert , oriented, and patient cooperative  Airway & Oxygen Therapy: Patient Spontanous Breathing and Patient connected to face mask oxygen  Post-op Assessment: Report given to RN and Post -op Vital signs reviewed and stable  Post vital signs: Reviewed and stable  Last Vitals:  Vitals Value Taken Time  BP 116/53 03/26/24 1231  Temp 36.8 C 03/26/24 1231  Pulse 75 03/26/24 1235  Resp 26 03/26/24 1235  SpO2 95 % 03/26/24 1235  Vitals shown include unfiled device data.  Last Pain:  Vitals:   03/26/24 1231  TempSrc: Temporal  PainSc: Asleep      Patients Stated Pain Goal: 3 (03/25/24 2200)  Complications: No notable events documented.

## 2024-03-27 DIAGNOSIS — D7589 Other specified diseases of blood and blood-forming organs: Secondary | ICD-10-CM | POA: Diagnosis present

## 2024-03-27 DIAGNOSIS — R109 Unspecified abdominal pain: Secondary | ICD-10-CM | POA: Diagnosis present

## 2024-03-27 DIAGNOSIS — D649 Anemia, unspecified: Secondary | ICD-10-CM

## 2024-03-27 DIAGNOSIS — Z79899 Other long term (current) drug therapy: Secondary | ICD-10-CM | POA: Diagnosis not present

## 2024-03-27 DIAGNOSIS — M940 Chondrocostal junction syndrome [Tietze]: Secondary | ICD-10-CM | POA: Diagnosis present

## 2024-03-27 DIAGNOSIS — R221 Localized swelling, mass and lump, neck: Secondary | ICD-10-CM | POA: Diagnosis present

## 2024-03-27 DIAGNOSIS — K2970 Gastritis, unspecified, without bleeding: Secondary | ICD-10-CM

## 2024-03-27 DIAGNOSIS — Z880 Allergy status to penicillin: Secondary | ICD-10-CM | POA: Diagnosis not present

## 2024-03-27 DIAGNOSIS — F17200 Nicotine dependence, unspecified, uncomplicated: Secondary | ICD-10-CM | POA: Diagnosis present

## 2024-03-27 DIAGNOSIS — D539 Nutritional anemia, unspecified: Secondary | ICD-10-CM | POA: Diagnosis present

## 2024-03-27 DIAGNOSIS — K5900 Constipation, unspecified: Secondary | ICD-10-CM | POA: Diagnosis present

## 2024-03-27 DIAGNOSIS — E039 Hypothyroidism, unspecified: Secondary | ICD-10-CM | POA: Diagnosis present

## 2024-03-27 DIAGNOSIS — K3189 Other diseases of stomach and duodenum: Secondary | ICD-10-CM | POA: Diagnosis present

## 2024-03-27 DIAGNOSIS — R1013 Epigastric pain: Secondary | ICD-10-CM | POA: Diagnosis present

## 2024-03-27 DIAGNOSIS — I10 Essential (primary) hypertension: Secondary | ICD-10-CM | POA: Diagnosis present

## 2024-03-27 DIAGNOSIS — E538 Deficiency of other specified B group vitamins: Secondary | ICD-10-CM | POA: Diagnosis present

## 2024-03-27 DIAGNOSIS — Z79890 Hormone replacement therapy (postmenopausal): Secondary | ICD-10-CM | POA: Diagnosis not present

## 2024-03-27 DIAGNOSIS — K1120 Sialoadenitis, unspecified: Secondary | ICD-10-CM | POA: Diagnosis present

## 2024-03-27 DIAGNOSIS — G629 Polyneuropathy, unspecified: Secondary | ICD-10-CM | POA: Diagnosis present

## 2024-03-27 LAB — CBC
HCT: 27.2 % — ABNORMAL LOW (ref 36.0–46.0)
Hemoglobin: 9.4 g/dL — ABNORMAL LOW (ref 12.0–15.0)
MCH: 38.8 pg — ABNORMAL HIGH (ref 26.0–34.0)
MCHC: 34.6 g/dL (ref 30.0–36.0)
MCV: 112.4 fL — ABNORMAL HIGH (ref 80.0–100.0)
Platelets: 169 10*3/uL (ref 150–400)
RBC: 2.42 MIL/uL — ABNORMAL LOW (ref 3.87–5.11)
RDW: 17.2 % — ABNORMAL HIGH (ref 11.5–15.5)
WBC: 6.2 10*3/uL (ref 4.0–10.5)
nRBC: 1 % — ABNORMAL HIGH (ref 0.0–0.2)

## 2024-03-27 LAB — BASIC METABOLIC PANEL WITH GFR
Anion gap: 7 (ref 5–15)
BUN: 6 mg/dL (ref 6–20)
CO2: 20 mmol/L — ABNORMAL LOW (ref 22–32)
Calcium: 8.8 mg/dL — ABNORMAL LOW (ref 8.9–10.3)
Chloride: 111 mmol/L (ref 98–111)
Creatinine, Ser: 1.18 mg/dL — ABNORMAL HIGH (ref 0.44–1.00)
GFR, Estimated: 55 mL/min — ABNORMAL LOW (ref >60)
Glucose, Bld: 110 mg/dL — ABNORMAL HIGH (ref 70–99)
Potassium: 4.2 mmol/L (ref 3.5–5.1)
Sodium: 138 mmol/L (ref 135–145)

## 2024-03-27 LAB — HEPATIC FUNCTION PANEL
ALT: 14 U/L (ref 0–44)
AST: 16 U/L (ref 15–41)
Albumin: 3.5 g/dL (ref 3.5–5.0)
Alkaline Phosphatase: 54 U/L (ref 38–126)
Bilirubin, Direct: 0.1 mg/dL (ref 0.0–0.2)
Indirect Bilirubin: 0.8 mg/dL (ref 0.3–0.9)
Total Bilirubin: 0.9 mg/dL (ref 0.0–1.2)
Total Protein: 6.4 g/dL — ABNORMAL LOW (ref 6.5–8.1)

## 2024-03-27 LAB — CMV IGM: CMV IgM: <30 [AU]/ml (ref 0.0–29.9)

## 2024-03-27 MED ORDER — POLYETHYLENE GLYCOL 3350 17 G PO PACK
17.0000 g | PACK | Freq: Two times a day (BID) | ORAL | Status: DC
  Administered 2024-03-27 – 2024-03-29 (×5): 17 g via ORAL
  Filled 2024-03-27 (×5): qty 1

## 2024-03-27 MED ORDER — SODIUM CHLORIDE 0.9 % IV BOLUS
1000.0000 mL | Freq: Once | INTRAVENOUS | Status: AC
  Administered 2024-03-27: 1000 mL via INTRAVENOUS

## 2024-03-27 NOTE — Progress Notes (Signed)
 PROGRESS NOTE    Maria Wright  EAV:[id number] DOB: 1971-10-18 DOA: 03/24/2024 PCP: Gail Joseph, FNP   Brief Narrative: Maria Wright is a 53 y.o. female with a history of hypertension and hypothyroidism.  Patient presented secondary to intractable abdominal pain with nausea and vomiting. GI consulted with plan for upper endoscopy. Vitamin B12 deficiency noted; started on vitamin B12 IM supplementation.   Assessment and Plan:  Intractable abdominal pain Nausea and vomiting Unclear etiology. Patient was seen by general surgery days prior for concern for gallbladder etiology, which was ruled out. Persistent symptoms concerning for possible gastric source of pain. Patient reports a history of NSAID use. [facility] consulted. Upper endoscopy performed on 6/9 and was significant for gastritis; biopsy obtained. Patient with significant pain; not tolerating her diet because of the pain. Emesis last night per patient. [facility] recommendations: Protonix, follow-up biopsy, dicyclomine -Recheck hepatic function panel -Continue Zofran  as needed -Continue analgesics as needed  Primary hypertension Patient is on amlodipine as an outpatient.  Amlodipine continued on admission.  Blood pressure currently uncontrolled. - Continue amlodipine  Elevated creatinine Not quite at a level to qualify as AKI, however, likely related to poor oral intake in setting of abdominal pain and emesis. -IV fluids  Hypothyroidism -Continue Synthroid  Possible red blood per rectum Patient notes a history of noticing possible blood per rectum. She is unclear if this is the case. She has not had a colonoscopy. Hemoglobin has drifted down since this admission. -Check FOBT  Constipation Some chronic component, made worse by narcotic analgesics. Associated abdominal distention -MiraLAX BID  Macrocytic anemia No recent lab values available. Unclear of chronicity. Hemoglobin of 10 on admission with MCV of  111.5. No evidence of bleeding. Vitamin B12 deficiency confirmed.  Vitamin B12 deficiency Vitamin B12 of 74. Vitamin B12 1000 IM started. -Continue Vitamin B12 1000 IM daily x7. May benefit from continuing B12 IM q Weekly x1 month and outpatient follow-up with repeat vitamin B12 check. Can likely discharge home with B12 IM with syringes/needles -Follow-up intrinsic factor abs  Bilateral neck swelling Likely parotiditis. Does not appear to be infection present but patient does have symptoms of tenderness. CT neck shows evidence of probable developing parotitis. Unlikely infection -Follow-up EBV/CMV  Peripheral polyneuropathy Non-focal. Unclear etiology. Patient with macrocytosis and skin hyperpigmentation. Vitamin B12 deficiency confirmed. Patient declining gabapentin at this time secondary to concerns of side effects. -Continue Gabapentin if patient desires   DVT prophylaxis: SCDs Code Status:   Code Status: Full Code Family Communication: Husband at bedside. Mother on telephone. Disposition Plan: Discharge likely in 24 hours after IV fluids, improvement of oral intake, improvement of nausea/vomiting   Consultants:  [facility]  Procedures:  None  Antimicrobials: None    Subjective: Patient still with emesis last night. Continues to have abdominal pain especially with eating.  Objective: BP 133/83 (BP Location: Right Arm)   Pulse 75   Temp 98.3 F (36.8 C) (Oral)   Resp 18   Ht 5\' 4"  (1.626 m)   Wt 72.6 kg   LMP  (LMP Unknown)   SpO2 99%   BMI 27.47 kg/m   Examination:  General exam: Appears calm and comfortable Respiratory system: Clear to auscultation. Respiratory effort normal. Cardiovascular system: S1 & S2 heard, RRR. No murmurs. Gastrointestinal system: Abdomen is distended, soft and nontender. Normal bowel sounds heard. Central nervous system: Alert and oriented. No focal neurological deficits. Musculoskeletal: No edema. No calf  tenderness Psychiatry: Judgement and insight appear normal.  Mood & affect appropriate.    Data Reviewed: I have personally reviewed following labs and imaging studies  CBC Lab Results  Component Value Date   WBC 6.2 03/27/2024   RBC 2.42 (L) 03/27/2024   HGB 9.4 (L) 03/27/2024   HCT 27.2 (L) 03/27/2024   MCV 112.4 (H) 03/27/2024   MCH 38.8 (H) 03/27/2024   PLT 169 03/27/2024   MCHC 34.6 03/27/2024   RDW 17.2 (H) 03/27/2024   LYMPHSABS 3.1 03/24/2024   MONOABS 0.2 03/24/2024   EOSABS 0.0 03/24/2024   BASOSABS 0.0 03/24/2024     Last metabolic panel Lab Results  Component Value Date   NA 138 03/27/2024   K 4.2 03/27/2024   CL 111 03/27/2024   CO2 20 (L) 03/27/2024   BUN 6 03/27/2024   CREATININE 1.18 (H) 03/27/2024   GLUCOSE 110 (H) 03/27/2024   GFRNONAA 55 (L) 03/27/2024   GFRAA >60 09/01/2018   CALCIUM 8.8 (L) 03/27/2024   PROT 6.4 (L) 03/27/2024   ALBUMIN 3.5 03/27/2024   BILITOT 0.9 03/27/2024   ALKPHOS 54 03/27/2024   AST 16 03/27/2024   ALT 14 03/27/2024   ANIONGAP 7 03/27/2024    GFR: Estimated Creatinine Clearance: 53.3 mL/min (A) (by C-G formula based on SCr of 1.18 mg/dL (H)).  No results found for this or any previous visit (from the past 240 hours).    Radiology Studies: CT SOFT TISSUE NECK W CONTRAST Result Date: 03/27/2024 CLINICAL DATA:  Initial evaluation for acute soft tissue infection. EXAM: CT NECK WITH CONTRAST TECHNIQUE: Multidetector CT imaging of the neck was performed using the standard protocol following the bolus administration of intravenous contrast. RADIATION DOSE REDUCTION: This exam was performed according to the departmental dose-optimization program which includes automated exposure control, adjustment of the mA and/or kV according to patient size and/or use of iterative reconstruction technique. CONTRAST:  75mL OMNIPAQUE  IOHEXOL  300 MG/ML  SOLN COMPARISON:  None Available. FINDINGS: Pharynx and larynx: Oral cavity within normal  limits. Oropharynx and nasopharynx within normal limits. No retropharyngeal collection. Negative epiglottis. Hypopharynx and supraglottic larynx within normal limits. Negative glottis. Subglottic airway clear. Salivary glands: Question subtle asymmetric hyperdensity about the right parotid gland as compared to the left, which could reflect subtle and/or early changes of acute parotitis. No obstructive stone. No discrete abscess or drainable fluid collection. Left parotid gland grossly within normal limits without convincing acute inflammatory changes. Submandibular glands within normal limits. Thyroid: Normal. Lymph nodes: No enlarged or pathologic lymph nodes within the neck. Vascular: Normal intravascular enhancement seen within the neck. Limited intracranial: Unremarkable. Visualized orbits: Unremarkable. Mastoids and visualized paranasal sinuses: Paranasal sinuses are largely clear. Visualized mastoids and middle ear cavities are clear as well. Skeleton: No worrisome osseous lesions. Moderate spondylosis at C4-5 and C5-6. Upper chest: Mild hazy subsegmental atelectatic changes noted dependently within the visualized lungs. No other acute finding. Other: None. IMPRESSION: 1. Question subtle asymmetric hyperdensity about the right parotid gland as compared to the left, which could reflect subtle and/or early changes of acute parotitis. Correlation with physical exam recommended. No obstructive stone or abscess. 2. No other acute abnormality within the neck. Electronically Signed   By: Virgia Griffins M.D.   On: 03/27/2024 01:18       LOS: 0 days    Aneita Keens, MD [facility] 03/27/2024, 2:32 PM   If 7PM-7AM, please contact night-coverage [email or web address]

## 2024-03-28 ENCOUNTER — Encounter (HOSPITAL_COMMUNITY): Payer: Self-pay | Admitting: Internal Medicine

## 2024-03-28 DIAGNOSIS — E039 Hypothyroidism, unspecified: Secondary | ICD-10-CM | POA: Insufficient documentation

## 2024-03-28 DIAGNOSIS — E538 Deficiency of other specified B group vitamins: Secondary | ICD-10-CM | POA: Insufficient documentation

## 2024-03-28 DIAGNOSIS — R109 Unspecified abdominal pain: Secondary | ICD-10-CM | POA: Diagnosis not present

## 2024-03-28 DIAGNOSIS — I10 Essential (primary) hypertension: Secondary | ICD-10-CM | POA: Insufficient documentation

## 2024-03-28 DIAGNOSIS — K2970 Gastritis, unspecified, without bleeding: Secondary | ICD-10-CM | POA: Insufficient documentation

## 2024-03-28 DIAGNOSIS — K5900 Constipation, unspecified: Secondary | ICD-10-CM | POA: Insufficient documentation

## 2024-03-28 DIAGNOSIS — R202 Paresthesia of skin: Secondary | ICD-10-CM | POA: Insufficient documentation

## 2024-03-28 LAB — BASIC METABOLIC PANEL WITH GFR
Anion gap: 5 (ref 5–15)
BUN: 6 mg/dL (ref 6–20)
CO2: 22 mmol/L (ref 22–32)
Calcium: 8.8 mg/dL — ABNORMAL LOW (ref 8.9–10.3)
Chloride: 114 mmol/L — ABNORMAL HIGH (ref 98–111)
Creatinine, Ser: 1.1 mg/dL — ABNORMAL HIGH (ref 0.44–1.00)
GFR, Estimated: 60 mL/min — ABNORMAL LOW (ref >60)
Glucose, Bld: 103 mg/dL — ABNORMAL HIGH (ref 70–99)
Potassium: 3.9 mmol/L (ref 3.5–5.1)
Sodium: 141 mmol/L (ref 135–145)

## 2024-03-28 LAB — CBC
HCT: 28.3 % — ABNORMAL LOW (ref 36.0–46.0)
Hemoglobin: 9.1 g/dL — ABNORMAL LOW (ref 12.0–15.0)
MCH: 38.1 pg — ABNORMAL HIGH (ref 26.0–34.0)
MCHC: 32.2 g/dL (ref 30.0–36.0)
MCV: 118.4 fL — ABNORMAL HIGH (ref 80.0–100.0)
Platelets: 178 10*3/uL (ref 150–400)
RBC: 2.39 MIL/uL — ABNORMAL LOW (ref 3.87–5.11)
RDW: 18.1 % — ABNORMAL HIGH (ref 11.5–15.5)
WBC: 6.2 10*3/uL (ref 4.0–10.5)
nRBC: 0.5 % — ABNORMAL HIGH (ref 0.0–0.2)

## 2024-03-28 LAB — INTRINSIC FACTOR ANTIBODIES: Intrinsic Factor: 140.9 [AU]/ml — ABNORMAL HIGH (ref 0.0–1.1)

## 2024-03-28 MED ORDER — PANTOPRAZOLE SODIUM 40 MG PO TBEC
40.0000 mg | DELAYED_RELEASE_TABLET | Freq: Two times a day (BID) | ORAL | Status: DC
  Administered 2024-03-28 – 2024-03-29 (×2): 40 mg via ORAL
  Filled 2024-03-28 (×2): qty 1

## 2024-03-28 MED ORDER — MAGNESIUM HYDROXIDE 400 MG/5ML PO SUSP
15.0000 mL | Freq: Every day | ORAL | Status: DC | PRN
  Administered 2024-03-28: 15 mL via ORAL
  Filled 2024-03-28: qty 30

## 2024-03-28 NOTE — TOC Initial Note (Addendum)
 Transition of Care [facility]) - Initial/Assessment Note    Patient Details  Name: Maria Wright MRN: [id number] Date of Birth: 1971-08-11  Transition of [facility] CM/SW Contact:    Ruben Corolla, RN Phone Number: 03/28/2024, 11:40 AM  Clinical Narrative:  Spoke to spouse Samuel Crock about d/c plans-agree to d/c home w/HHC/dme-no preference-[facility] rep angel checking if able to accept-await outcome;[employer] rep Jermaine to deliver rollator,3n1 to rm prior d/c. Family to transport home on own.               [facility]aren Channel for HHPT accepted.  Expected Discharge Plan: Home w Home Health Services Barriers to Discharge: Continued Medical Work up   Patient Goals and CMS Choice Patient states their goals for this hospitalization and ongoing recovery are:: Home CMS Medicare.gov Compare Post Acute Care list provided to:: Patient Represenative (must comment) (Frank(spouse)) Choice offered to / list presented to : Spouse  ownership interest in [facility].provided to:: Spouse    Expected Discharge Plan and Services   Discharge Planning Services: CM Consult Post Acute Care Choice: Home Health, Durable Medical Equipment                     DME Agency: [employer] Date DME Agency Contacted: 03/28/24 Time DME Agency Contacted: 1140 Representative spoke with at DME Agency: Zula Hitch HH Arranged: PT   Date HH Agency Contacted: 03/28/24 Time HH Agency Contacted: 1140    Prior Living Arrangements/Services   Lives with:: Spouse   Do you feel safe going back to the place where you live?: Yes          Current home services: DME, Home PT    Activities of Daily Living   ADL Screening (condition at time of admission) Independently performs ADLs?: Yes (appropriate for developmental age) Is the patient deaf or have difficulty hearing?: No Does the patient have difficulty seeing, even when wearing glasses/contacts?: No Does the patient have difficulty concentrating,  remembering, or making decisions?: No  Permission Sought/Granted Permission sought to share information with : Case Manager                Emotional Assessment              Admission diagnosis:  Epigastric pain [R10.13] Chest pain [R07.9] Intractable abdominal pain [R10.9] Patient Active Problem List   Diagnosis Date Noted   Intractable abdominal pain 03/27/2024   Chest pain 03/24/2024   Symptomatic anemia 04/30/2018   Iron deficiency anemia 04/30/2018   PCP:  Gail Joseph, FNP Pharmacy:   [id number] - [place], [place] - [address] [place] [address] Phone: [phone] Fax: [phone]  [id number] - [place], [place] - [address] [place] [address] Phone: [phone] Fax: [phone]     Social Drivers of Health (SDOH) Social History: SDOH Screenings   Food Insecurity: Patient Declined (03/24/2024)  Housing: Patient Declined (03/24/2024)  Transportation Needs: Patient Declined (03/24/2024)  Utilities: Patient Declined (03/24/2024)  Social Connections: Patient Declined (03/24/2024)  Tobacco Use: High Risk (03/26/2024)   SDOH Interventions:     Readmission Risk Interventions     No data to display

## 2024-03-28 NOTE — Evaluation (Signed)
 Occupational Therapy Evaluation Patient Details Name: Maria Wright MRN: [id number] DOB: 10-Aug-1971 Today's Date: 03/28/2024   History of Present Illness   Pt is a 53 yr old female admitted on 03/24/24 after presenting with intractable abdominal pain with nausea and vomiting. Upper endoscopy on 6/9 was significant for gastritis. There is also a concern for vitamin B12 deficiency. PMH: HTN, hypothyroidism     Clinical Impressions The pt is currently presenting with the below listed deficits (see OT problem list). Due to such deficits, she is also presenting below her baseline level of functioning for self-care management. She reported onset of acute symptoms approximately 2 months ago; symptoms have been ongoing and include lightheadedness in standing, balance deficits causing near falls, fatigue with activity, significant numbness of her hands and feet, dark discoloration of her hands and feet, and pain in her abdomen radiating to her back. During the session today, she required SBA for lower body dressing seated EOB/donning socks, sit to stand with RW, and for toileting at bathroom level. OT educated the pt and her spouse on equipment recommendations for home including a tub transfer bench and bedside commode, as well as strategies to facilitate safe ADL participation. She will benefit from further OT services to maximize her independence with self-care tasks, and to decrease the risk for restricted participation in meaningful activities. Home health OT is recommended.      If plan is discharge home, recommend the following:   A little help with bathing/dressing/bathroom;Help with stairs or ramp for entrance;Assistance with cooking/housework     Functional Status Assessment   Patient has had a recent decline in their functional status and demonstrates the ability to make significant improvements in function in a reasonable and predictable amount of time.     Equipment Recommendations    Tub/shower bench;BSC/3in1     Recommendations for Other Services         Precautions/Restrictions   Precautions Precautions: Fall Restrictions Weight Bearing Restrictions Per Provider Order: No     Mobility Bed Mobility   Bed Mobility: Supine to Sit     Supine to sit: Modified independent (Device/Increase time) Sit to supine: Modified independent (Device/Increase time)        Transfers Overall transfer level: Needs assistance Equipment used: Rolling walker (2 wheels) Transfers: Sit to/from Stand Sit to Stand: Supervision                  Balance     Sitting balance-Leahy Scale: Good       Standing balance-Leahy Scale: Fair            ADL either performed or assessed with clinical judgement   ADL Overall ADL's : Needs assistance/impaired Eating/Feeding: Set up;Sitting Eating/Feeding Details (indicate cue type and reason): Intermittent assist needed to cut food, open packets, and remove lids due to numbness of hands Grooming: Supervision/safety;Set up;Standing Grooming Details (indicate cue type and reason): She performed hand washing in standing at the sink. She required support of a RW in standing.         Upper Body Dressing : Set up;Sitting   Lower Body Dressing: Set up;Supervision/safety;Sitting/lateral leans Lower Body Dressing Details (indicate cue type and reason): Pt able to doff then socks seated EOB with SBA. Toilet Transfer: Set up;Supervision/safety;Rolling walker (2 wheels);Ambulation;Grab bars Toilet Transfer Details (indicate cue type and reason): Pt ambulated to and from the bathroom in her room using a RW. Toileting- Clothing Manipulation and Hygiene: Set up;Supervision/safety;Sit to/from stand Toileting - Architect Details (indicate  cue type and reason): Toileting tasks were performed with the pt at bathroom level.       General ADL Comments: The pt and her spouse reported the pt has been having difficulty  performing toileting hygiene, due to numbness of her hands. OT educated them on the option for use of a toilet bidet to assist in this regard. They also stated the pt has required occasional assist to transfer into and out of the tub at home, with OT educating them on use of a tub transfer bench for performing safe tub transfers, as well as seated bathing for added safety and decreased falls risk. Lastly, they were educated on the 3 uses of a 3-in-1 bedside commode for self-care tasks.     Vision   Additional Comments: She denied having acute vision changes or deficits.            Pertinent Vitals/Pain Pain Assessment Pain Assessment: 0-10 Pain Score: 7  Pain Location: below sternum/upper abdomen Pain Intervention(s): Limited activity within patient's tolerance, Monitored during session     Extremity/Trunk Assessment Upper Extremity Assessment Upper Extremity Assessment: RUE deficits/detail;Right hand dominant;LUE deficits/detail RUE Deficits / Details:  (AROM WFL. Biceps, triceps, and grip strength WFL. Grip strength grossly 4/5. Pt reports significant numbness of her hands, which started ~6 to 8 weeks ago.) RUE Sensation: decreased light touch RUE Coordination: decreased fine motor LUE Deficits / Details:  (AROM WFL. Biceps, triceps, and grip strength WFL. Grip strength grossly 4/5. Pt reports significant numbness of her hands, which started ~6 to 8 weeks ago.) LUE Sensation: decreased light touch LUE Coordination: decreased fine motor   Lower Extremity Assessment Lower Extremity Assessment: LLE deficits/detail;RLE deficits/detail RLE Deficits / Details:  (AROM and strength WFL) RLE Sensation: decreased light touch LLE Deficits / Details:  (AROM and strength WFL) LLE Sensation: decreased light touch     Communication Communication Communication: No apparent difficulties   Cognition Arousal: Alert Behavior During Therapy: WFL for tasks assessed/performed                OT - Cognition Comments: Oriented x4                 Following commands: Intact                  Home Living Family/patient expects to be discharged to:: Private residence Living Arrangements: Spouse/significant other. Her spouse works 1st shift from ~6:00 a.m. to 6:00 p.m. Available Help at Discharge: Family;Available PRN/intermittently Type of Home: Apartment Home Access: Stairs to enter Entrance Stairs-Number of Steps: 2 flights to enter apartment Entrance Stairs-Rails: Right;Left Home Layout: One level     Bathroom Shower/Tub: Dietitian: None          Prior Functioning/Environment               Mobility Comments:  (Ambulatory without an assistive device, denies falls but has had several near falls over the past ~2 months, due to acute balance issues.) ADLs Comments: Pt has been limited, due to onset of symptoms which started ~2 months ago. She has been cooking as tolerated, but spouse having to help more recently. Her spouse has also been assisting with bathing, as the pt has been with decreased ability to feel pressure/sensation when scrubbing self; noting more difficulty performing peri-hygiene, dropping items.    OT Problem List: Decreased strength;Impaired balance (sitting and/or standing);Decreased coordination;Decreased knowledge of use of DME or AE;Impaired  sensation;Pain   OT Treatment/Interventions: Self-care/ADL training;Therapeutic exercise;Therapeutic activities;Energy conservation;Patient/family education;DME and/or AE instruction;Balance training      OT Goals(Current goals can be found in the care plan section)   Acute Rehab OT Goals Patient Stated Goal: resolution of symptoms OT Goal Formulation: With patient/family Time For Goal Achievement: 04/11/24 Potential to Achieve Goals: Good ADL Goals Pt Will Perform Grooming: with modified independence;standing Pt Will Perform Lower Body Dressing: with  modified independence;sitting/lateral leans;sit to/from stand Pt Will Transfer to Toilet: with modified independence;ambulating Pt Will Perform Toileting - Clothing Manipulation and hygiene: with modified independence;sit to/from stand   OT Frequency:  Min 2X/week       AM-PAC OT 6 Clicks Daily Activity     Outcome Measure Help from another person eating meals?: A Little Help from another person taking care of personal grooming?: A Little Help from another person toileting, which includes using toliet, bedpan, or urinal?: A Little Help from another person bathing (including washing, rinsing, drying)?: A Little Help from another person to put on and taking off regular upper body clothing?: None Help from another person to put on and taking off regular lower body clothing?: A Little 6 Click Score: 19   End of Session Equipment Utilized During Treatment: Rolling walker (2 wheels) Nurse Communication: Mobility status  Activity Tolerance: Patient tolerated treatment well Patient left: in bed;with call bell/phone within reach;with family/visitor present  OT Visit Diagnosis: Other abnormalities of gait and mobility (R26.89);Muscle weakness (generalized) (M62.81)                Time: 6578-4696 OT Time Calculation (min): 33 min Charges:  OT General Charges $OT Visit: 1 Visit OT Evaluation $OT Eval Moderate Complexity: 1 Mod OT Treatments $Self Care/Home Management : 8-22 mins    Sheralyn Dies, OTR/L 03/28/2024, 2:51 PM

## 2024-03-28 NOTE — Evaluation (Signed)
 Physical Therapy Evaluation Patient Details Name: Maria Wright MRN: [id number] DOB: [birth year] Today's Date: 03/28/2024  History of Present Illness  Pt is a 53 y/o F admitted on 03/24/24 after presenting with c/o intractable abdominal pain with N&V. Upper endoscopy on 6/9 was significant for gastritis. PMH: HTN, hypothyroidism  Clinical Impression  Pt seen for PT evaluation with pt agreeable, spouse prseent in room. Pt reports prior to admission she was living with her spouse in a 3rd floor apartment with 2 flights of stairs to access. Pt reports she's independent without AD but fearful of falling, denies falls but notes a lot of near misses. On this date, pt endorses abdominal/truncal, BUE & BLE decreased sensation (L worse than R). Provided pt with RW for increased comfort & stability with gait. Pt is able to ambulate with RW & CGA with cuing re: proper use of AD, multiple standing rest breaks 2/2 fatigue & feeling like she's going too fast. Pt is able to void & perform peri hygiene without assistance but noting it's getting more difficult at home. Will continue to follow pt acutely to progress mobility & reduce fall risk.      If plan is discharge home, recommend the following: A little help with walking and/or transfers;A little help with bathing/dressing/bathroom;Assistance with cooking/housework;Assist for transportation;Help with stairs or ramp for entrance   Can travel by private vehicle        Equipment Recommendations BSC/3in1;Rolling walker (2 wheels)  Recommendations for Other Services       Functional Status Assessment Patient has had a recent decline in their functional status and demonstrates the ability to make significant improvements in function in a reasonable and predictable amount of time.     Precautions / Restrictions Precautions Precautions: Fall Restrictions Weight Bearing Restrictions Per Provider Order: No      Mobility  Bed Mobility Overal bed mobility:  Needs Assistance Bed Mobility: Supine to Sit, Sit to Supine     Supine to sit: Modified independent (Device/Increase time), HOB elevated, Used rails Sit to supine: Modified independent (Device/Increase time), HOB elevated, Used rails        Transfers Overall transfer level: Needs assistance Equipment used: Rolling walker (2 wheels) Transfers: Sit to/from Stand Sit to Stand: Supervision, Modified independent (Device/Increase time)           General transfer comment: sit<>stand from EOB, toilet, cuing for hand placement to push to standing    Ambulation/Gait Ambulation/Gait assistance: Contact guard assist Gait Distance (Feet): 45 Feet Assistive device: Rolling walker (2 wheels) Gait Pattern/deviations: Decreased step length - right, Decreased step length - left, Decreased stride length, Decreased dorsiflexion - right, Decreased dorsiflexion - left Gait velocity: decreased     General Gait Details: multiple rest breaks, pt reports she feels she's going too fast but gait speed very reduced  Stairs            Wheelchair Mobility     Tilt Bed    Modified Rankin (Stroke Patients Only)       Balance Overall balance assessment: Needs assistance Sitting-balance support: Feet supported Sitting balance-Leahy Scale: Good Sitting balance - Comments: toileting without assistance   Standing balance support: No upper extremity supported, During functional activity Standing balance-Leahy Scale: Fair Standing balance comment: close supervision for hand hygiene standing at sink without BUE support                             Pertinent Vitals/Pain Pain  Assessment Pain Assessment: 0-10 Pain Score: 7  Pain Location: below sternum/upper abdomen Pain Descriptors / Indicators: Throbbing, Spasm Pain Intervention(s): Limited activity within patient's tolerance, Monitored during session    Home Living Family/patient expects to be discharged to:: Private  residence Living Arrangements: Spouse/significant other Available Help at Discharge: Family;Available PRN/intermittently (spouse works during the day) Type of Home: Apartment Home Access: Stairs to enter Entrance Stairs-Rails: Doctor, general practice of Steps: 2 flights   Home Layout: One level Home Equipment: None      Prior Function               Mobility Comments: Ambulatory without AD, denies falls but endorses several near misses. ADLs Comments: Still cooking as tolerated, but spouse having to help more. Spouse assisting with bathing as pt with decreased ability to feel pressure/sensation when scrubbing self, noting more difficulty performing peri hygiene, dropping items.     Extremity/Trunk Assessment   Upper Extremity Assessment Upper Extremity Assessment: Defer to OT evaluation (decreased speed with finger opposition in BUE)    Lower Extremity Assessment Lower Extremity Assessment: RLE deficits/detail;LLE deficits/detail RLE Deficits / Details: BLE proprioception WNL LLE Deficits / Details: BLE proprioception WNL LLE Sensation: decreased light touch    Cervical / Trunk Assessment Cervical / Trunk Assessment:  (notes decreased sensation to abdomen/trunk)  Communication   Communication Communication: No apparent difficulties    Cognition Arousal: Alert Behavior During Therapy: WFL for tasks assessed/performed   PT - Cognitive impairments: No apparent impairments                         Following commands: Intact       Cueing Cueing Techniques: Verbal cues     General Comments General comments (skin integrity, edema, etc.): Pt with continent void without assistance.    Exercises Other Exercises Other Exercises: Educated pt on use of walker bag to transport items, recommendation of RW vs rollator at this time.   Assessment/Plan    PT Assessment Patient needs continued PT services  PT Problem List Decreased  strength;Pain;Decreased range of motion;Decreased activity tolerance;Impaired sensation;Decreased knowledge of use of DME;Decreased balance;Decreased mobility;Decreased safety awareness       PT Treatment Interventions DME instruction;Gait training;Neuromuscular re-education;Balance training;Stair training;Functional mobility training;Therapeutic activities;Therapeutic exercise;Patient/family education    PT Goals (Current goals can be found in the Care Plan section)  Acute Rehab PT Goals Patient Stated Goal: get better PT Goal Formulation: With patient/family Time For Goal Achievement: 04/11/24 Potential to Achieve Goals: Good    Frequency Min 3X/week     Co-evaluation               AM-PAC PT 6 Clicks Mobility  Outcome Measure Help needed turning from your back to your side while in a flat bed without using bedrails?: None Help needed moving from lying on your back to sitting on the side of a flat bed without using bedrails?: A Little Help needed moving to and from a bed to a chair (including a wheelchair)?: A Little Help needed standing up from a chair using your arms (e.g., wheelchair or bedside chair)?: A Little Help needed to walk in hospital room?: A Little Help needed climbing 3-5 steps with a railing? : A Lot 6 Click Score: 18    End of Session   Activity Tolerance: Patient tolerated treatment well;Patient limited by fatigue Patient left: in bed;with call bell/phone within reach;with family/visitor present Nurse Communication: Mobility status PT Visit Diagnosis: Muscle weakness (  generalized) (M62.81);Other abnormalities of gait and mobility (R26.89);Difficulty in walking, not elsewhere classified (R26.2);Other symptoms and signs involving the nervous system (R29.898);Unsteadiness on feet (R26.81)    Time: 1610-9604 PT Time Calculation (min) (ACUTE ONLY): 23 min   Charges:   PT Evaluation $PT Eval Moderate Complexity: 1 Mod   PT General Charges $$ ACUTE PT  VISIT: 1 Visit         Emaline Handsome, PT, DPT 03/28/24, 11:19 AM   Venetta Gill 03/28/2024, 11:17 AM

## 2024-03-28 NOTE — Progress Notes (Signed)
 PROGRESS NOTE    Emonie Espericueta  ZOX:[id number] DOB: 1971-04-23 DOA: 03/24/2024 PCP: Gail Joseph, FNP     Brief Narrative:  Ambre Kobayashi is a 53 y.o. female with a history of hypertension and hypothyroidism.  Patient presented secondary to intractable abdominal pain with nausea and vomiting. GI consulted with plan for upper endoscopy. Vitamin B12 deficiency noted; started on vitamin B12 IM supplementation.  EGD on 6/9 revealed gastritis.  New events last 24 hours / Subjective: Patient still with significant epigastric abdominal pain.  States that the pain is sharp in nature, comes and goes, also radiates bilaterally and wraps around to her back. Feels better to rub her upper abdomen. States that pain is worse sometimes when she wears a bra.  Pain is worse when she moves in a certain way, rotates.  Also has bilateral hand numbness and tingling.  This started about 6 weeks ago.  Has outpatient orthopedic referral pending.  Has been tolerating diet without issues.  Has not had a bowel movement since Sunday.  Also feeling weak, asking for a walker on discharge.  Assessment & Plan:   Principal Problem:   Intractable abdominal pain Active Problems:   Chest pain   Gastritis   HTN (hypertension)   Hypothyroidism   Paresthesia of both hands   Constipation   B12 deficiency   Intractable abdominal pain, nausea and vomiting - Gallbladder etiology has been ruled out - GI consulted, status post EGD 6/9 which revealed gastritis with biopsy obtained - ?  Costochondritis, her pain is around her xyphoid process and radiates bilaterally along her lower ribs, extending to her back.  Unfortunately unable to trial NSAID or prednisone due to her gastritis - Pain control with hydrocodone, tylenol  PRN - Tolerating diet today   Bilateral hand paresthesias  - ? Carpal tunnel, although it is affecting both hands and all 10 fingers equally and she has no history of overuse syndrome. She does have  outpatient orthopedic referral pending. Would recommend outpatient EMG work up  - ? Vitamin deficiency etiology, she does have low B12 which is being replaced   - Check B1, B6 also. Folate WNL   HTN - Norvasc  Hypothyroidism - Synthroid  Constipation - Miralax and milk of mag ordered  Vit B12 deficiency - Replace. Continue Vitamin B12 1000 IM daily x7. May benefit from continuing B12 IM q Weekly x1 month and outpatient follow-up with repeat vitamin B12 check.   - Intrinsic factor 140.9   Bilateral neck swelling - CT showed possible parotitis - CMV neg - EBV pending    DVT prophylaxis: SCDs Start: 03/24/24 1518 Code Status: Full Family Communication: At bedside Disposition Plan: Home with home health  Status is: Inpatient Remains inpatient appropriate because: Not feeling well     Antimicrobials:  Anti-infectives (From admission, onward)    None        Objective: Vitals:   03/27/24 1256 03/27/24 2103 03/28/24 0626 03/28/24 1146  BP: 133/83 (!) 147/82 130/85 (!) 146/83  Pulse: 75 66 69 63  Resp: 18 18 18 18   Temp: 98.3 F (36.8 C) 98.8 F (37.1 C) 98.1 F (36.7 C) 98.6 F (37 C)  TempSrc: Oral Oral Oral Oral  SpO2: 99% 100% 100% 100%  Weight:      Height:        Intake/Output Summary (Last 24 hours) at 03/28/2024 1521 Last data filed at 03/28/2024 0900 Gross per 24 hour  Intake 360 ml  Output 400 ml  Net -40  ml   Filed Weights   03/24/24 0126 03/26/24 1123  Weight: 72.6 kg 72.6 kg    Examination:  General exam: Appears calm, but uncomfortable  Respiratory system: Clear to auscultation. Respiratory effort normal. No respiratory distress. No conversational dyspnea.  Cardiovascular system: S1 & S2 heard, RRR. No murmurs. No pedal edema. Gastrointestinal system: Abdomen is nondistended, soft and mildly tender to palpation epigastrium Central nervous system: Alert and oriented. No focal neurological deficits. Speech clear.  Extremities: Symmetric in  appearance  Skin: No rashes, lesions or ulcers on exposed skin  Psychiatry: Judgement and insight appear normal. Mood & affect appropriate.   Data Reviewed: I have personally reviewed following labs and imaging studies  CBC: Recent Labs  Lab 03/22/24 0850 03/24/24 0109 03/27/24 0511 03/28/24 0528  WBC 7.7 8.6 6.2 6.2  NEUTROABS  --  5.2  --   --   HGB 10.0* 9.8* 9.4* 9.1*  HCT 29.2* 27.7* 27.2* 28.3*  MCV 111.5* 107.8* 112.4* 118.4*  PLT 196 204 169 178   Basic Metabolic Panel: Recent Labs  Lab 03/24/24 0109 03/25/24 1030 03/26/24 0736 03/26/24 1558 03/27/24 0511 03/28/24 0528  NA 142 140 137  --  138 141  K 3.6 3.3* 3.4* 3.3* 4.2 3.9  CL 107 111 106  --  111 114*  CO2 17* 19* 24  --  20* 22  GLUCOSE 108* 77 92  --  110* 103*  BUN 11 10 7   --  6 6  CREATININE 1.11* 0.85 0.96  --  1.18* 1.10*  CALCIUM 10.0 8.6* 8.6*  --  8.8* 8.8*  MG  --   --   --  1.8  --   --    GFR: Estimated Creatinine Clearance: 57.1 mL/min (A) (by C-G formula based on SCr of 1.1 mg/dL (H)). Liver Function Tests: Recent Labs  Lab 03/22/24 0850 03/24/24 0109 03/27/24 0507  AST 20 24 16   ALT 13 15 14   ALKPHOS 61 82 54  BILITOT 1.0 1.2 0.9  PROT 7.0 7.6 6.4*  ALBUMIN 3.9 4.7 3.5   Recent Labs  Lab 03/22/24 0850 03/24/24 0109  LIPASE 26 15   No results for input(s): AMMONIA in the last 168 hours. Coagulation Profile: No results for input(s): INR, PROTIME in the last 168 hours. Cardiac Enzymes: No results for input(s): CKTOTAL, CKMB, CKMBINDEX, TROPONINI in the last 168 hours. BNP (last 3 results) No results for input(s): PROBNP in the last 8760 hours. HbA1C: No results for input(s): HGBA1C in the last 72 hours. CBG: No results for input(s): GLUCAP in the last 168 hours. Lipid Profile: No results for input(s): CHOL, HDL, LDLCALC, TRIG, CHOLHDL, LDLDIRECT in the last 72 hours. Thyroid Function Tests: No results for input(s): TSH, T4TOTAL,  FREET4, T3FREE, THYROIDAB in the last 72 hours. Anemia Panel: No results for input(s): VITAMINB12, FOLATE, FERRITIN, TIBC, IRON, RETICCTPCT in the last 72 hours. Sepsis Labs: No results for input(s): PROCALCITON, LATICACIDVEN in the last 168 hours.  No results found for this or any previous visit (from the past 240 hours).    Radiology Studies: No results found.    Scheduled Meds:  amLODipine  5 mg Oral Daily   cyanocobalamin  1,000 mcg Intramuscular Daily   dicyclomine  20 mg Oral TID AC & HS   gabapentin  100 mg Oral TID   [START ON 04/01/2024] levothyroxine  125 mcg Oral Every Sunday   levothyroxine  62.5 mcg Oral Once per day on Monday Tuesday Wednesday Thursday Friday  Saturday   nystatin cream   Topical BID   pantoprazole  40 mg Oral BID AC   polyethylene glycol  17 g Oral BID   sucralfate   1 g Oral TID WC & HS   Continuous Infusions:   LOS: 1 day   Time spent: 50 minutes   Daren Eck, DO [facility] 03/28/2024, 3:21 PM   Available via Epic secure chat 7am-7pm After these hours, please refer to coverage provider listed on [email or web address]

## 2024-03-29 ENCOUNTER — Other Ambulatory Visit (HOSPITAL_COMMUNITY): Payer: Self-pay

## 2024-03-29 ENCOUNTER — Encounter (HOSPITAL_COMMUNITY): Payer: Self-pay | Admitting: Gastroenterology

## 2024-03-29 DIAGNOSIS — R109 Unspecified abdominal pain: Secondary | ICD-10-CM | POA: Diagnosis not present

## 2024-03-29 LAB — CBC
HCT: 29.9 % — ABNORMAL LOW (ref 36.0–46.0)
Hemoglobin: 10 g/dL — ABNORMAL LOW (ref 12.0–15.0)
MCH: 37.7 pg — ABNORMAL HIGH (ref 26.0–34.0)
MCHC: 33.4 g/dL (ref 30.0–36.0)
MCV: 112.8 fL — ABNORMAL HIGH (ref 80.0–100.0)
Platelets: 171 10*3/uL (ref 150–400)
RBC: 2.65 MIL/uL — ABNORMAL LOW (ref 3.87–5.11)
RDW: 17.8 % — ABNORMAL HIGH (ref 11.5–15.5)
WBC: 6.4 10*3/uL (ref 4.0–10.5)
nRBC: 0.5 % — ABNORMAL HIGH (ref 0.0–0.2)

## 2024-03-29 LAB — BASIC METABOLIC PANEL WITH GFR
Anion gap: 9 (ref 5–15)
BUN: 9 mg/dL (ref 6–20)
CO2: 25 mmol/L (ref 22–32)
Calcium: 9.1 mg/dL (ref 8.9–10.3)
Chloride: 105 mmol/L (ref 98–111)
Creatinine, Ser: 1.34 mg/dL — ABNORMAL HIGH (ref 0.44–1.00)
GFR, Estimated: 47 mL/min — ABNORMAL LOW (ref >60)
Glucose, Bld: 104 mg/dL — ABNORMAL HIGH (ref 70–99)
Potassium: 3.8 mmol/L (ref 3.5–5.1)
Sodium: 139 mmol/L (ref 135–145)

## 2024-03-29 MED ORDER — VITAMIN D (ERGOCALCIFEROL) 1.25 MG (50000 UNIT) PO CAPS
50000.0000 [IU] | ORAL_CAPSULE | ORAL | 0 refills | Status: AC
  Filled 2024-03-29: qty 8, 56d supply, fill #0

## 2024-03-29 MED ORDER — CYANOCOBALAMIN 1000 MCG/ML IJ SOLN
INTRAMUSCULAR | 0 refills | Status: AC
  Filled 2024-03-29: qty 7, 28d supply, fill #0

## 2024-03-29 MED ORDER — HYDROCODONE-ACETAMINOPHEN 5-325 MG PO TABS
1.0000 | ORAL_TABLET | ORAL | 0 refills | Status: AC | PRN
Start: 2024-03-29 — End: ?
  Filled 2024-03-29: qty 30, 5d supply, fill #0

## 2024-03-29 MED ORDER — HYDROXYZINE PAMOATE 50 MG PO CAPS
50.0000 mg | ORAL_CAPSULE | Freq: Every evening | ORAL | 0 refills | Status: AC | PRN
  Filled 2024-03-29: qty 10, 10d supply, fill #0

## 2024-03-29 MED ORDER — METHOCARBAMOL 500 MG PO TABS
500.0000 mg | ORAL_TABLET | Freq: Three times a day (TID) | ORAL | 0 refills | Status: AC | PRN
  Filled 2024-03-29: qty 30, 10d supply, fill #0

## 2024-03-29 MED ORDER — POLYETHYLENE GLYCOL 3350 17 GM/SCOOP PO POWD
17.0000 g | Freq: Two times a day (BID) | ORAL | 0 refills | Status: AC
  Filled 2024-03-29: qty 238, 7d supply, fill #0

## 2024-03-29 NOTE — Progress Notes (Signed)
 Physical Therapy Treatment Patient Details Name: Maria Wright MRN: [id number] DOB: 19-May-1971 Today's Date: 03/29/2024   History of Present Illness Pt is a 53 y/o F admitted on 03/24/24 after presenting with c/o intractable abdominal pain with N&V. Upper endoscopy on 6/9 was significant for gastritis. PMH: HTN, hypothyroidism    PT Comments  Pt very motivated and ambulated in hallway improved distance today.  Pt reports PCP referred her to orthopaedics but she has not been able to get appointment. She states PCP was aware of hand numbness but not of LE symptoms (reports decreased sensation and weakness in L LE > R LE however has been happening since Feb per pt report).  Pt encouraged to seek neurologist input (or further discuss symptoms with PCP for referral) due to her current description of symptoms.  Current d/c plan is for home with HHPT.    If plan is discharge home, recommend the following: A little help with walking and/or transfers;A little help with bathing/dressing/bathroom;Assistance with cooking/housework;Assist for transportation;Help with stairs or ramp for entrance   Can travel by private vehicle        Equipment Recommendations  BSC/3in1;Rolling walker (2 wheels)    Recommendations for Other Services       Precautions / Restrictions Precautions Precautions: Fall     Mobility  Bed Mobility Overal bed mobility: Modified Independent                  Transfers Overall transfer level: Needs assistance Equipment used: Rolling walker (2 wheels) Transfers: Sit to/from Stand Sit to Stand: Supervision           General transfer comment: sit to stand from bed without physical assist, able to turn and don backside gown without LOB    Ambulation/Gait Ambulation/Gait assistance: Contact guard assist, Supervision Gait Distance (Feet): 340 Feet Assistive device: Rolling walker (2 wheels) Gait Pattern/deviations: Step-through pattern, Decreased stride length,  Narrow base of support Gait velocity: decreased     General Gait Details: slow but steady pace with RW, pt reports decreased sensation and strength L > R LE however reports symptoms since Feb   Stairs             Wheelchair Mobility     Tilt Bed    Modified Rankin (Stroke Patients Only)       Balance Overall balance assessment: Needs assistance         Standing balance support: During functional activity, No upper extremity supported Standing balance-Leahy Scale: Good                              Communication Communication Communication: No apparent difficulties  Cognition Arousal: Alert Behavior During Therapy: WFL for tasks assessed/performed   PT - Cognitive impairments: No apparent impairments                         Following commands: Intact      Cueing Cueing Techniques: Verbal cues  Exercises      General Comments        Pertinent Vitals/Pain Pain Assessment Pain Assessment: No/denies pain Pain Intervention(s): Monitored during session, Repositioned    Home Living                          Prior Function            PT Goals (current goals can now be  found in the care plan section) Progress towards PT goals: Progressing toward goals    Frequency    Min 3X/week      PT Plan      Co-evaluation              AM-PAC PT 6 Clicks Mobility   Outcome Measure  Help needed turning from your back to your side while in a flat bed without using bedrails?: None Help needed moving from lying on your back to sitting on the side of a flat bed without using bedrails?: A Little Help needed moving to and from a bed to a chair (including a wheelchair)?: A Little Help needed standing up from a chair using your arms (e.g., wheelchair or bedside chair)?: A Little Help needed to walk in hospital room?: A Little Help needed climbing 3-5 steps with a railing? : A Little 6 Click Score: 19    End of Session  Equipment Utilized During Treatment: Gait belt Activity Tolerance: Patient tolerated treatment well Patient left: with family/visitor present Nurse Communication: Mobility status PT Visit Diagnosis: Difficulty in walking, not elsewhere classified (R26.2)     Time: 4098-1191 PT Time Calculation (min) (ACUTE ONLY): 20 min  Charges:    $Gait Training: 8-22 mins PT General Charges $$ ACUTE PT VISIT: 1 Visit                     Blanch Bunde, DPT Physical Therapist Acute Rehabilitation Services Office: [phone]   Myna Asal Payson 03/29/2024, 3:34 PM

## 2024-03-29 NOTE — TOC Transition Note (Signed)
 Transition of Care [facility]) - Discharge Note   Patient Details  Name: Maria Wright MRN: [id number] Date of Birth: 1971/08/22  Transition of [facility]) CM/SW Contact:  Ruben Corolla, RN Phone Number: 03/29/2024, 10:33 AM   Clinical Narrative:  d/c home w/[facility]-rep Shelvy Dickens accepted. [employer] rollator,3n1 delivered-spouse already has dme. Has own transport. Spoke to Frank-informed hime to sign into my chart to have access to patient records-voiced understanding.No further CM needs.     Final next level of care: Home w Home Health Services Barriers to Discharge: No Barriers Identified   Patient Goals and CMS Choice Patient states their goals for this hospitalization and ongoing recovery are:: Home CMS Medicare.gov Compare Post Acute Care list provided to:: Patient Represenative (must comment) (Frank(spouse)) Choice offered to / list presented to : Spouse [place] ownership interest in [facility].provided to:: Spouse    Discharge Placement                       Discharge Plan and Services Additional resources added to the After Visit Summary for     Discharge Planning Services: CM Consult Post Acute Care Choice: Home Health            DME Agency: [employer] Date DME Agency Contacted: 03/28/24 Time DME Agency Contacted: 1140 Representative spoke with at DME Agency: Zula Hitch HH Arranged: PT   Date HH Agency Contacted: 03/28/24 Time HH Agency Contacted: 1140    Social Drivers of Health (SDOH) Interventions SDOH Screenings   Food Insecurity: Patient Declined (03/24/2024)  Housing: Patient Declined (03/24/2024)  Transportation Needs: Patient Declined (03/24/2024)  Utilities: Patient Declined (03/24/2024)  Social Connections: Patient Declined (03/24/2024)  Tobacco Use: High Risk (03/26/2024)     Readmission Risk Interventions     No data to display

## 2024-03-29 NOTE — Discharge Summary (Signed)
 Physician Discharge Summary  Maria Wright ZOX:[id number] DOB: April 01, 1971 DOA: 03/24/2024  PCP: Gail Joseph, FNP  Admit date: 03/24/2024 Discharge date: 03/29/2024  Admitted From: home Disposition:  home with home health   Recommendations for Outpatient Follow-up:  Follow up with PCP in 1 week Follow up with GI Dr. Feliberto Hopping for outpatient colonoscopy  Follow up on pending labs: B1, B6, EBV   Discharge Condition: Stable CODE STATUS: Full  Diet recommendation: Regular   Brief/Interim Summary: Maria Wright is a 53 y.o. female with a history of hypertension and hypothyroidism.  Patient presented secondary to intractable abdominal pain with nausea and vomiting. GI consulted with plan for upper endoscopy. Vitamin B12 deficiency noted; started on vitamin B12 IM supplementation.  EGD on 6/9 revealed gastritis.   Discharge Diagnoses:   Principal Problem:   Intractable abdominal pain Active Problems:   Chest pain   Gastritis   HTN (hypertension)   Hypothyroidism   Paresthesia of both hands   Constipation   B12 deficiency    Intractable abdominal pain, nausea and vomiting - Gallbladder etiology has been ruled out - GI consulted, status post EGD 6/9 which revealed gastritis with biopsy obtained - ?  Costochondritis, her pain is around her xyphoid process and radiates bilaterally along her lower ribs, extending to her back.  Unfortunately unable to trial NSAID or prednisone due to her gastritis - Pain control with hydrocodone, tylenol  PRN, robaxin  - Tolerating diet today, no further nausea and vomiting    Bilateral hand paresthesias  - ? Carpal tunnel, although it is affecting both hands and all 10 fingers equally and she has no history of overuse syndrome. She does have outpatient orthopedic referral pending. Would recommend outpatient EMG work up  - ? Vitamin deficiency etiology, she does have low B12 which is being replaced   - B1, B6 pending. Folate WNL    HTN - Norvasc    Hypothyroidism - Synthroid - TSH WNL    Constipation - Miralax ordered    Vit B12 deficiency - Replace. Continue Vitamin B12 1000 IM daily x7. May benefit from continuing B12 IM q Weekly x1 month and outpatient follow-up with repeat vitamin B12 check.   - Intrinsic factor 140.9    Bilateral neck swelling - CT showed possible parotitis - CMV neg - EBV pending      Discharge Instructions  Discharge Instructions     Call MD for:  difficulty breathing, headache or visual disturbances   Complete by: As directed    Call MD for:  extreme fatigue   Complete by: As directed    Call MD for:  hives   Complete by: As directed    Call MD for:  persistant dizziness or light-headedness   Complete by: As directed    Call MD for:  persistant nausea and vomiting   Complete by: As directed    Call MD for:  severe uncontrolled pain   Complete by: As directed    Call MD for:  temperature >100.4   Complete by: As directed    Discharge instructions   Complete by: As directed    You were cared for by a hospitalist during your hospital stay. If you have any questions about your discharge medications or the care you received while you were in the hospital after you are discharged, you can call the unit and ask to speak with the hospitalist on call if the hospitalist that took care of you is not available. Once you are discharged,  your primary care physician will handle any further medical issues. Please note that NO REFILLS for any discharge medications will be authorized once you are discharged, as it is imperative that you return to your primary care physician (or establish a relationship with a primary care physician if you do not have one) for your aftercare needs so that they can reassess your need for medications and monitor your lab values.   Increase activity slowly   Complete by: As directed       Allergies as of 03/29/2024       Reactions   Penicillins Hives   Has patient had a PCN  reaction causing immediate rash, facial/tongue/throat swelling, SOB or lightheadedness with hypotension: Yes Has patient had a PCN reaction causing severe rash involving mucus membranes or skin necrosis: Yes Has patient had a PCN reaction that required hospitalization: No Has patient had a PCN reaction occurring within the last 10 years: No If all of the above answers are NO, then may proceed with Cephalosporin use.   Latex Rash        Medication List     TAKE these medications    acetaminophen  650 MG CR tablet Commonly known as: TYLENOL  Take 650-1,300 mg by mouth as needed for pain.   amLODipine 5 MG tablet Commonly known as: NORVASC Take 5 mg by mouth in the morning.   cyanocobalamin 1000 MCG/ML injection Commonly known as: VITAMIN B12 Once daily for 3 days, then once weekly for one month   HYDROcodone-acetaminophen  5-325 MG tablet Commonly known as: NORCO/VICODIN Take 1 tablet by mouth every 4 (four) hours as needed for severe pain (pain score 7-10).   hydrOXYzine 50 MG capsule Commonly known as: VISTARIL Take 1 capsule (50 mg total) by mouth at bedtime as needed (for sleep).   levothyroxine 125 MCG tablet Commonly known as: SYNTHROID Take 125 mcg by mouth See admin instructions. Take 0.5 tablet by mouth every day except on Sundays, then take 1 tablet by mouth every week on Sunday   methocarbamol 500 MG tablet Commonly known as: ROBAXIN Take 1 tablet (500 mg total) by mouth every 8 (eight) hours as needed for muscle spasms.   omeprazole 40 MG capsule Commonly known as: PRILOSEC Take 40 mg by mouth in the morning.   polyethylene glycol 17 g packet Commonly known as: MIRALAX / GLYCOLAX Take 17 g by mouth 2 (two) times daily.   prochlorperazine 10 MG tablet Commonly known as: COMPAZINE Take 1 tablet (10 mg total) by mouth 2 (two) times daily as needed for nausea or vomiting.   sucralfate 1 g tablet Commonly known as: Carafate Take 1 tablet (1 g total) by  mouth 4 (four) times daily -  with meals and at bedtime.   Vitamin D (Ergocalciferol) 1.25 MG (50000 UNIT) Caps capsule Commonly known as: DRISDOL Take 1 capsule (50,000 Units total) by mouth every 7 (seven) days. For 8 weeks               Durable Medical Equipment  (From admission, onward)           Start     Ordered   03/28/24 1137  For home use only DME Walker rolling  Once       Comments: rollator  Question Answer Comment  Walker: With 5 Inch Wheels   Patient needs a walker to treat with the following condition Weakness      06 /11/25 1137   03/28/24 1114  For home use only DME  Bedside commode  Once       Question:  Patient needs a bedside commode to treat with the following condition  Answer:  Weakness   03/28/24 1114            Follow-up Information     [facility] Follow up.   Why: rollator,bedside commode Contact information: [address] [place] [address] [phone]        [facility] Follow up.   Specialty: Home Health Services Why: [facility] physical therapy Contact information: [address] [place] [address] [phone]         Gail Joseph, FNP Follow up.   Specialty: Nurse Practitioner Contact information: [address] [place] [address] [phone]         Genell Ken, MD Follow up.   Specialty: Gastroenterology Why: Need outpatient colonoscopy Contact information: [address] [place] [address] [phone]                Allergies  Allergen Reactions   Penicillins Hives    Has patient had a PCN reaction causing immediate rash, facial/tongue/throat swelling, SOB or lightheadedness with hypotension: Yes Has patient had a PCN reaction causing severe rash involving mucus membranes or skin necrosis: Yes Has patient had a PCN reaction that required hospitalization: No Has patient had a PCN reaction occurring within the last 10 years: No If all of the  above answers are NO, then may proceed with Cephalosporin use.    Latex Rash     Procedures/Studies: CT SOFT TISSUE NECK W CONTRAST Result Date: 03/27/2024 CLINICAL DATA:  Initial evaluation for acute soft tissue infection. EXAM: CT NECK WITH CONTRAST TECHNIQUE: Multidetector CT imaging of the neck was performed using the standard protocol following the bolus administration of intravenous contrast. RADIATION DOSE REDUCTION: This exam was performed according to the departmental dose-optimization program which includes automated exposure control, adjustment of the mA and/or kV according to patient size and/or use of iterative reconstruction technique. CONTRAST:  75mL OMNIPAQUE  IOHEXOL  300 MG/ML  SOLN COMPARISON:  None Available. FINDINGS: Pharynx and larynx: Oral cavity within normal limits. Oropharynx and nasopharynx within normal limits. No retropharyngeal collection. Negative epiglottis. Hypopharynx and supraglottic larynx within normal limits. Negative glottis. Subglottic airway clear. Salivary glands: Question subtle asymmetric hyperdensity about the right parotid gland as compared to the left, which could reflect subtle and/or early changes of acute parotitis. No obstructive stone. No discrete abscess or drainable fluid collection. Left parotid gland grossly within normal limits without convincing acute inflammatory changes. Submandibular glands within normal limits. Thyroid: Normal. Lymph nodes: No enlarged or pathologic lymph nodes within the neck. Vascular: Normal intravascular enhancement seen within the neck. Limited intracranial: Unremarkable. Visualized orbits: Unremarkable. Mastoids and visualized paranasal sinuses: Paranasal sinuses are largely clear. Visualized mastoids and middle ear cavities are clear as well. Skeleton: No worrisome osseous lesions. Moderate spondylosis at C4-5 and C5-6. Upper chest: Mild hazy subsegmental atelectatic changes noted dependently within the visualized lungs.  No other acute finding. Other: None. IMPRESSION: 1. Question subtle asymmetric hyperdensity about the right parotid gland as compared to the left, which could reflect subtle and/or early changes of acute parotitis. Correlation with physical exam recommended. No obstructive stone or abscess. 2. No other acute abnormality within the neck. Electronically Signed   By: Virgia Griffins M.D.   On: 03/27/2024 01:18   CT Angio Chest/Abd/Pel for Dissection W and/or Wo Contrast Result Date: 03/24/2024 CLINICAL DATA:  Chest and back pain, initial encounter EXAM: CT ANGIOGRAPHY CHEST, ABDOMEN AND PELVIS TECHNIQUE: Non-contrast CT of the chest was initially obtained. Multidetector CT imaging through the chest, abdomen and pelvis was performed using the standard protocol during bolus administration of intravenous contrast. Multiplanar reconstructed images and MIPs were obtained and reviewed to evaluate the vascular anatomy. RADIATION DOSE REDUCTION: This exam was performed according to the departmental dose-optimization program which includes automated exposure control, adjustment of the mA and/or kV according to patient size and/or use of iterative reconstruction technique. CONTRAST:  OMNIPAQUE  IOHEXOL  350 MG/ML SOLN COMPARISON:  Chest x-ray from earlier in the same day. FINDINGS: CTA CHEST FINDINGS Cardiovascular: Initial precontrast images show no hyperdense crescent to suggest acute aortic injury. Postcontrast images show no aneurysmal dilatation of the aorta. No dissection is noted. The heart is not significantly enlarged. Pulmonary artery shows a normal branching pattern bilaterally. No intraluminal filling defect to suggest pulmonary embolism is noted. No significant coronary calcifications are noted. Mediastinum/Nodes: Thoracic inlet is within normal limits. No hilar or mediastinal adenopathy is noted. The esophagus as visualized is within normal limits. Lungs/Pleura: Lungs are well aerated bilaterally. No  focal infiltrate is noted. Stable atelectasis is seen at the right base. No parenchymal nodules are seen. Musculoskeletal: No acute rib abnormality is noted. Review of the MIP images confirms the above findings. CTA ABDOMEN AND PELVIS FINDINGS VASCULAR Aorta: Abdominal aorta shows no aneurysmal dilatation or dissection. Mild atherosclerotic calcifications are seen. Celiac: Patent without evidence of aneurysm, dissection, vasculitis or significant stenosis. SMA: Patent without evidence of aneurysm, dissection, vasculitis or significant stenosis. Renals: Both renal arteries are patent without evidence of aneurysm, dissection, vasculitis, fibromuscular dysplasia or significant stenosis. IMA: Patent without evidence of aneurysm, dissection, vasculitis or significant stenosis. Inflow: Iliacs show mild atherosclerotic calcification without focal stenosis or dilatation. Veins: No specific venous abnormality is noted. Review of the MIP images confirms the above findings. NON-VASCULAR Hepatobiliary: Liver is within normal limits. Vicarious excretion of contrast in the gallbladder. No discrete stones are seen at this time. Pancreas: Unremarkable. No pancreatic ductal dilatation or surrounding inflammatory changes. Spleen: Normal in size without focal abnormality. Adrenals/Urinary Tract: Adrenal glands are within normal limits. Kidneys demonstrate no renal calculi or obstructive changes. The bladder is decompressed. Stomach/Bowel: The appendix is within normal limits. No obstructive or inflammatory changes of the colon are seen. Small bowel and stomach are within normal limits. Lymphatic: No lymphadenopathy is seen. Reproductive: Uterus and bilateral adnexa are unremarkable. Other: No abdominal wall hernia or abnormality. No abdominopelvic ascites. Musculoskeletal: No acute or significant osseous findings. Review of the MIP images confirms the above findings. IMPRESSION: CTA of the chest: No evidence of aneurysmal dilatation  or dissection. Minimal right basilar atelectasis. CTA of the abdomen and pelvis: No acute aortic abnormality is noted. No acute abnormality noted. Electronically Signed   By: Violeta Grey M.D.   On: 03/24/2024 02:44   DG Chest Portable 1 View Result Date: 03/24/2024 CLINICAL DATA:  Chest pain EXAM: PORTABLE CHEST 1 VIEW COMPARISON:  03/22/2024 FINDINGS: The heart size and mediastinal contours are within normal limits. Both lungs are clear. The visualized skeletal structures are unremarkable. IMPRESSION: No active disease. Electronically Signed   By: Juanetta Nordmann M.D.   On: 03/24/2024 02:17   US  Abdomen Limited Result Date: 03/22/2024 CLINICAL DATA:  upper abdominal pain EXAM: ULTRASOUND ABDOMEN LIMITED RIGHT UPPER QUADRANT COMPARISON:  March 22, 2024 FINDINGS: Gallbladder: A single mobile gallstone present. No wall thickening or pericholecystic fluid. Common  bile duct: Diameter: 4 mm Liver: Normal echogenicity. No focal lesion identified. No intrahepatic biliary ductal dilation. Portal vein is patent on color Doppler imaging with normal direction of blood flow towards the liver. Other: The visualized portions of the pancreas are within normal limits. IMPRESSION: Cholecystolithiasis. No sonographic changes of acute cholecystitis. Electronically Signed   By: Rance Burrows M.D.   On: 03/22/2024 15:04   CT ABDOMEN PELVIS W WO CONTRAST Result Date: 03/22/2024 CLINICAL DATA:  Abdominal pain, acute, nonlocalized Abdominal/flank pain, stone suspected Abdominal pain with pain under breast and nausea. EXAM: CT ABDOMEN AND PELVIS WITHOUT AND WITH CONTRAST TECHNIQUE: Multidetector CT imaging of the abdomen and pelvis was performed following the standard protocol before and following the bolus administration of intravenous contrast. RADIATION DOSE REDUCTION: This exam was performed according to the departmental dose-optimization program which includes automated exposure control, adjustment of the mA and/or kV according to  patient size and/or use of iterative reconstruction technique. CONTRAST:  75mL OMNIPAQUE  IOHEXOL  350 MG/ML SOLN COMPARISON:  Abdominopelvic CT 03/20/2015. FINDINGS: Lower chest: Probable mild scarring dependently at the right lung base. The visualized lung bases are otherwise clear. No pleural or pericardial effusion. Hepatobiliary: The liver is normal in density without suspicious focal abnormality. Small gallstones are noted. No evidence of gallbladder wall thickening, surrounding inflammation or biliary ductal dilatation. Pancreas: Unremarkable. No pancreatic ductal dilatation or surrounding inflammatory changes. Spleen: Normal in size without focal abnormality. Adrenals/Urinary Tract: Both adrenal glands appear normal. Pre contrast images demonstrate no renal, ureteral or bladder calculi. Following contrast, both kidneys enhance normally. No evidence of enhancing renal mass. There is a small cyst in the upper pole of the right kidney for which no specific follow-up imaging is recommended. The bladder appears unremarkable for its degree of distention. Stomach/Bowel: No enteric contrast administered. The stomach appears unremarkable for its degree of distension. No evidence of bowel wall thickening, distention or surrounding inflammatory change. The appendix appears normal. Vascular/Lymphatic: There are no enlarged abdominal or pelvic lymph nodes. Aortic and branch vessel atherosclerosis without evidence of aneurysm or large vessel occlusion. Reproductive: The uterus and ovaries appear unremarkable. No adnexal mass. Other: No evidence of abdominal wall mass or hernia. No ascites or pneumoperitoneum. Musculoskeletal: No acute or significant osseous findings. IMPRESSION: 1. No acute findings or explanation for the patient's symptoms. 2. Cholelithiasis without evidence of cholecystitis or biliary ductal dilatation. 3. No evidence of urinary tract calculus or hydronephrosis. 4.  Aortic Atherosclerosis (ICD10-I70.0).  Electronically Signed   By: Elmon Hagedorn M.D.   On: 03/22/2024 11:06   DG Chest Port 1 View Result Date: 03/22/2024 CLINICAL DATA:  Epigastric abdominal pain. EXAM: PORTABLE CHEST 1 VIEW COMPARISON:  September 01, 2018. FINDINGS: The heart size and mediastinal contours are within normal limits. Both lungs are clear. The visualized skeletal structures are unremarkable. IMPRESSION: No active disease. Electronically Signed   By: Rosalene Colon M.D.   On: 03/22/2024 08:50      Discharge Exam: Vitals:   03/28/24 1146 03/28/24 2007  BP: (!) 146/83 131/74  Pulse: 63 61  Resp: 18 18  Temp: 98.6 F (37 C) 98.3 F (36.8 C)  SpO2: 100% 99%    General: Pt is alert, awake, not in acute distress Cardiovascular: RRR, S1/S2 +, no edema Respiratory: CTA bilaterally, no wheezing, no rhonchi, no respiratory distress, no conversational dyspnea  Abdominal: Soft, NT, ND, bowel sounds + Extremities: no edema, no cyanosis Psych: Normal mood and affect, stable judgement and insight  The results of significant diagnostics from this hospitalization (including imaging, microbiology, ancillary and laboratory) are listed below for reference.     Microbiology: No results found for this or any previous visit (from the past 240 hours).   Labs: BNP (last 3 results) No results for input(s): BNP in the last 8760 hours. Basic Metabolic Panel: Recent Labs  Lab 03/25/24 1030 03/26/24 0736 03/26/24 1558 03/27/24 0511 03/28/24 0528 03/29/24 0515  NA 140 137  --  138 141 139  K 3.3* 3.4* 3.3* 4.2 3.9 3.8  CL 111 106  --  111 114* 105  CO2 19* 24  --  20* 22 25  GLUCOSE 77 92  --  110* 103* 104*  BUN 10 7  --  6 6 9   CREATININE 0.85 0.96  --  1.18* 1.10* 1.34*  CALCIUM 8.6* 8.6*  --  8.8* 8.8* 9.1  MG  --   --  1.8  --   --   --    Liver Function Tests: Recent Labs  Lab 03/24/24 0109 03/27/24 0507  AST 24 16  ALT 15 14  ALKPHOS 82 54  BILITOT 1.2 0.9  PROT 7.6 6.4*  ALBUMIN 4.7 3.5    Recent Labs  Lab 03/24/24 0109  LIPASE 15   No results for input(s): AMMONIA in the last 168 hours. CBC: Recent Labs  Lab 03/24/24 0109 03/27/24 0511 03/28/24 0528 03/29/24 0515  WBC 8.6 6.2 6.2 6.4  NEUTROABS 5.2  --   --   --   HGB 9.8* 9.4* 9.1* 10.0*  HCT 27.7* 27.2* 28.3* 29.9*  MCV 107.8* 112.4* 118.4* 112.8*  PLT 204 169 178 171   Cardiac Enzymes: No results for input(s): CKTOTAL, CKMB, CKMBINDEX, TROPONINI in the last 168 hours. BNP: Invalid input(s): POCBNP CBG: No results for input(s): GLUCAP in the last 168 hours. D-Dimer No results for input(s): DDIMER in the last 72 hours. Hgb A1c No results for input(s): HGBA1C in the last 72 hours. Lipid Profile No results for input(s): CHOL, HDL, LDLCALC, TRIG, CHOLHDL, LDLDIRECT in the last 72 hours. Thyroid function studies No results for input(s): TSH, T4TOTAL, T3FREE, THYROIDAB in the last 72 hours.  Invalid input(s): FREET3 Anemia work up No results for input(s): VITAMINB12, FOLATE, FERRITIN, TIBC, IRON, RETICCTPCT in the last 72 hours. Urinalysis    Component Value Date/Time   COLORURINE YELLOW 03/24/2024 0119   APPEARANCEUR CLEAR 03/24/2024 0119   LABSPEC 1.015 03/24/2024 0119   PHURINE 7.5 03/24/2024 0119   GLUCOSEU NEGATIVE 03/24/2024 0119   HGBUR TRACE (A) 03/24/2024 0119   BILIRUBINUR NEGATIVE 03/24/2024 0119   KETONESUR NEGATIVE 03/24/2024 0119   PROTEINUR NEGATIVE 03/24/2024 0119   NITRITE NEGATIVE 03/24/2024 0119   LEUKOCYTESUR NEGATIVE 03/24/2024 0119   Sepsis Labs Recent Labs  Lab 03/24/24 0109 03/27/24 0511 03/28/24 0528 03/29/24 0515  WBC 8.6 6.2 6.2 6.4   Microbiology No results found for this or any previous visit (from the past 240 hours).   Patient was seen and examined on the day of discharge and was found to be in stable condition. Time coordinating discharge: 50 minutes including assessment and coordination of care, as  well as examination of the patient.   SIGNED:  Daren Eck, DO [facility] 03/29/2024, 10:15 AM

## 2024-04-03 LAB — VITAMIN B6: Vitamin B6: 5.4 ug/L (ref 3.4–65.2)

## 2024-04-04 LAB — VITAMIN B1: Vitamin B1 (Thiamine): 98.2 nmol/L (ref 66.5–200.0)
